# Patient Record
Sex: Male | Born: 1960 | Race: White | Hispanic: No | Marital: Single | Smoking: Current every day smoker
Health system: Southern US, Community
[De-identification: ages and names within clinical notes are randomized; demographics above are authoritative.]

## PROBLEM LIST (undated history)

## (undated) DIAGNOSIS — C801 Malignant (primary) neoplasm, unspecified: Secondary | ICD-10-CM

---

## 2016-07-06 DIAGNOSIS — D689 Coagulation defect, unspecified: Secondary | ICD-10-CM

## 2016-07-06 HISTORY — DX: Coagulation defect, unspecified: D68.9

## 2020-02-13 ENCOUNTER — Other Ambulatory Visit: Payer: Self-pay

## 2020-02-13 ENCOUNTER — Encounter (HOSPITAL_BASED_OUTPATIENT_CLINIC_OR_DEPARTMENT_OTHER): Payer: Self-pay | Admitting: *Deleted

## 2020-02-13 ENCOUNTER — Emergency Department (HOSPITAL_BASED_OUTPATIENT_CLINIC_OR_DEPARTMENT_OTHER)
Admission: EM | Admit: 2020-02-13 | Discharge: 2020-02-13 | Disposition: A | Discharge status: Home / Self Care | Payer: Managed Care, Other (non HMO) | Attending: Emergency Medicine | Admitting: Emergency Medicine

## 2020-02-13 ENCOUNTER — Emergency Department (HOSPITAL_COMMUNITY): Payer: Managed Care, Other (non HMO)

## 2020-02-13 DIAGNOSIS — K8020 Calculus of gallbladder without cholecystitis without obstruction: Secondary | ICD-10-CM

## 2020-02-13 DIAGNOSIS — F17210 Nicotine dependence, cigarettes, uncomplicated: Secondary | ICD-10-CM | POA: Insufficient documentation

## 2020-02-13 DIAGNOSIS — F1090 Alcohol use, unspecified, uncomplicated: Secondary | ICD-10-CM

## 2020-02-13 DIAGNOSIS — Z720 Tobacco use: Secondary | ICD-10-CM

## 2020-02-13 DIAGNOSIS — C250 Malignant neoplasm of head of pancreas: Secondary | ICD-10-CM

## 2020-02-13 DIAGNOSIS — Z789 Other specified health status: Secondary | ICD-10-CM

## 2020-02-13 DIAGNOSIS — Z7289 Other problems related to lifestyle: Secondary | ICD-10-CM

## 2020-02-13 DIAGNOSIS — K8689 Other specified diseases of pancreas: Secondary | ICD-10-CM | POA: Diagnosis not present

## 2020-02-13 DIAGNOSIS — Z79899 Other long term (current) drug therapy: Secondary | ICD-10-CM | POA: Diagnosis not present

## 2020-02-13 DIAGNOSIS — R17 Unspecified jaundice: Secondary | ICD-10-CM

## 2020-02-13 DIAGNOSIS — R7401 Elevation of levels of liver transaminase levels: Secondary | ICD-10-CM

## 2020-02-13 DIAGNOSIS — R5383 Other fatigue: Secondary | ICD-10-CM | POA: Diagnosis present

## 2020-02-13 HISTORY — DX: Elevation of levels of liver transaminase levels: R74.01

## 2020-02-13 HISTORY — DX: Other specified health status: Z78.9

## 2020-02-13 HISTORY — DX: Other problems related to lifestyle: Z72.89

## 2020-02-13 HISTORY — DX: Calculus of gallbladder without cholecystitis without obstruction: K80.20

## 2020-02-13 HISTORY — DX: Unspecified jaundice: R17

## 2020-02-13 HISTORY — DX: Tobacco use: Z72.0

## 2020-02-13 HISTORY — DX: Alcohol use, unspecified, uncomplicated: F10.90

## 2020-02-13 LAB — CBC WITH DIFFERENTIAL/PLATELET
Abs Immature Granulocytes: 0.04 10*3/uL (ref 0.00–0.07)
Basophils Absolute: 0.1 10*3/uL (ref 0.0–0.1)
Basophils Relative: 1 %
Eosinophils Absolute: 0.2 10*3/uL (ref 0.0–0.5)
Eosinophils Relative: 3 %
HCT: 41.5 % (ref 39.0–52.0)
Hemoglobin: 14.6 g/dL (ref 13.0–17.0)
Immature Granulocytes: 1 %
Lymphocytes Relative: 23 %
Lymphs Abs: 1.5 10*3/uL (ref 0.7–4.0)
MCH: 28.9 pg (ref 26.0–34.0)
MCHC: 35.2 g/dL (ref 30.0–36.0)
MCV: 82 fL (ref 80.0–100.0)
Monocytes Absolute: 0.9 10*3/uL (ref 0.1–1.0)
Monocytes Relative: 14 %
Neutro Abs: 3.7 10*3/uL (ref 1.7–7.7)
Neutrophils Relative %: 58 %
Platelets: 294 10*3/uL (ref 150–400)
RBC: 5.06 MIL/uL (ref 4.22–5.81)
RDW: 16.9 % — ABNORMAL HIGH (ref 11.5–15.5)
WBC: 6.4 10*3/uL (ref 4.0–10.5)
nRBC: 0 % (ref 0.0–0.2)

## 2020-02-13 LAB — COMPREHENSIVE METABOLIC PANEL
ALT: 439 U/L — ABNORMAL HIGH (ref 0–44)
AST: 204 U/L — ABNORMAL HIGH (ref 15–41)
Albumin: 3.5 g/dL (ref 3.5–5.0)
Alkaline Phosphatase: 495 U/L — ABNORMAL HIGH (ref 38–126)
Anion gap: 11 (ref 5–15)
BUN: 10 mg/dL (ref 6–20)
CO2: 23 mmol/L (ref 22–32)
Calcium: 8.8 mg/dL — ABNORMAL LOW (ref 8.9–10.3)
Chloride: 98 mmol/L (ref 98–111)
Creatinine, Ser: 0.68 mg/dL (ref 0.61–1.24)
GFR calc Af Amer: >60 mL/min (ref >60)
GFR calc non Af Amer: >60 mL/min (ref >60)
Glucose, Bld: 120 mg/dL — ABNORMAL HIGH (ref 70–99)
Potassium: 3.8 mmol/L (ref 3.5–5.1)
Sodium: 132 mmol/L — ABNORMAL LOW (ref 135–145)
Total Bilirubin: 10.1 mg/dL — ABNORMAL HIGH (ref 0.3–1.2)
Total Protein: 8 g/dL (ref 6.5–8.1)

## 2020-02-13 LAB — LIPASE, BLOOD: Lipase: 18 U/L (ref 11–51)

## 2020-02-13 LAB — PROTIME-INR
INR: 1.1 (ref 0.8–1.2)
Prothrombin Time: 13.8 seconds (ref 11.4–15.2)

## 2020-02-13 LAB — HIV ANTIBODY (ROUTINE TESTING W REFLEX): HIV Screen 4th Generation wRfx: NONREACTIVE

## 2020-02-13 MED ORDER — GADOBUTROL 1 MMOL/ML IV SOLN
7.5000 mL | Freq: Once | INTRAVENOUS | Status: AC | PRN
  Administered 2020-02-13: 7.5 mL via INTRAVENOUS

## 2020-02-13 NOTE — ED Notes (Signed)
Patient denies pain and is resting comfortably.  

## 2020-02-13 NOTE — ED Notes (Signed)
Pt POV to [facility] ED. Pt given directions. Pt advised to remain NPO and told to report directly to the ED without making any additional stops.

## 2020-02-13 NOTE — ED Provider Notes (Addendum)
59 year old male presents with painless jaundice and fatigue from [facility]. Seen at [facility] last week and had a CT abdomen/pelvis on 5/7 which showed:  IMPRESSION: 1. Focal edematous thickening of the pancreatic head and uncinate with adjacent hazy stranding, suggestive of acute interstitial edematous pancreatitis. No visible pancreatic necrosis or acute peripancreatic fluid collection. 2. Mild intra and extrahepatic biliary ductal dilatation. Distended gallbladder without visible calcified gallstone. If there is clinical concern for acute cholecystitis or choledocholithiasis consider next step imaging with right upper quadrant ultrasound. 3. Mild bladder wall thickening, likely related to underdistention. Recommend correlation with urinalysis to exclude cystitis. 4. Several small segments of apparent colonic wall thickening towards the sigmoid and rectum has an appearance most compatible with normal peristalsis. Correlate for acute symptoms. 5. Hepatic steatosis. 6. Punctate nonobstructing upper pole right renal calculus. 7. Peribronchovascular cuffing with diffuse interlobular septal thickening in the lung bases suggest interstitial pulmonary edema. 8. Aortic Atherosclerosis (ICD10-I70.0)  LFTs and bilirubin have been persistently elevated:  AST 271 (May 5) > 230 (May 6) > 204 (today) ALT 576 > 521 > 439 AP: 532 > 551 > 495 Bili: 3.6 > 4.2 > 10.1  His vitals are reassuring here. GI was consulted (Dr. Paulita Fujita) and it was recommended he have a MRCP w contrast therefore was transferred to [facility]. On exam he is calm and cooperative. He is jaundiced. He denies any significant complaints at this time. MRCP ordered and is pending.  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 Will discuss with GI. Pt is upset because he has been NPO - he was given some [place] sandwiches and is happier now.  Discussed with Dr. Paulita Fujita - he states pt will need endoscopic [place] and ERCP however  this cannot be done until Saturday. He states pt can be admitted if he is very symptomatic or he can f/u as an outpatient and have the procedure done early next week. He can also stay with [facility] if he chooses. Discussed these options with pt. He has been comfortable here other than not being able to eat. I discussed with him his diagnosis and he verbalized understanding. He feels comfortable going home and will follow up with GI next week. Epic message was also sent to oncology to expedite him establishing care with them.     Recardo Evangelist, PA-C 02/13/20 2013    Recardo Evangelist, PA-C 02/13/20 2020    Margette Fast, MD 02/14/20 [phone]

## 2020-02-13 NOTE — Plan of Care (Signed)
[facility] is aware of patient's impending arrival to the [facility] emergency room.  I recommend MRI/MRCP to further evaluate.  [facility] is aware of the patient and will see them tomorrow morning.

## 2020-02-13 NOTE — ED Triage Notes (Signed)
Abdominal pain. Jaundiced. Fatigue.

## 2020-02-13 NOTE — ED Provider Notes (Signed)
[place] EMERGENCY DEPARTMENT Provider Note   CSN: [id number] Arrival date & time: 02/13/20  1101     History Chief Complaint  Patient presents with  . Abdominal Pain    Brandon Schmitt is a 59 y.o. male.  HPI   Patient is a 59 year old male with inconsistent primary care follow-up.  Complaining of recent increase over the last few months of exercise intolerance.  He works a physical job as a Architect and says he just does not have the energy that he used to.  He said that he is not having specific shortness of breath and denies any chest pain but just says the energy is not there.  He said he has had some mild intermittent abdominal pains during this but that the size of his belly has not changed.  He recently went to an urgent care for this and they drew some labs after which they told him that he needed to go to the hospital at [facility] for further evaluation.  Imaging there on CT abdomen found chole lithiasis without cholecystitis, there was bile duct dilation and stranding around the pancreatic head.  He did have elevated bilirubin to 4.2, AST ALTs were elevated to 230 and 521 respectively.  Hepatitis panel was negative, creatinine was within normal limits.  Patient left the emergency department AMA and went to work for a week over at [employer] returning now to the [facility] system for the first time because he does not wish to go back to [employer] [facility]  He says he has some intermittent abdominal tenderness but nothing significant and he has been able to eat as well as have normal bowel movements, he said that his urine has been darker and that has him concerned as well as with the fatigue.  He is concerned that he is dehydrated but he has no complaints of dysuria.  Of note patient says he only drinks 2-3 alcoholic drinks per month, he does smoke 1 pack/day of cigarettes, has not had regular primary care in years.  Does have a remote history of blood clot in one  of his legs that he says was "3 feet long ", he said that he was put on anticoagulation for an unknown amount of time after that and was told by the physician that he did not need to take anymore.  The only medications he has been taking regularly was 2 aspirins of unknown dose daily that he heard was good for heart which he has been taking long-term until his visit with [facility] when he stopped after they told him to.  He is also been taking 2 Centrum Silver's per day which again he is stopped after going to [employer].    History reviewed. No pertinent past medical history.  Patient Active Problem List   Diagnosis Date Noted  . Total bilirubin, elevated 02/13/2020  . Elevated transaminase level 02/13/2020  . Tobacco abuse 02/13/2020  . Alcohol use 02/13/2020  . Cholelithiasis 02/13/2020    History reviewed. No pertinent surgical history.     No family history on file.  Social History   Tobacco Use  . Smoking status: Current Every Day Smoker  . Smokeless tobacco: Never Used  Substance Use Topics  . Alcohol use: Yes  . Drug use: Never    Home Medications Prior to Admission medications   Not on File    Allergies    Patient has no known allergies.  Review of Systems  Review of Systems  Constitutional: Positive for fatigue. Negative for activity change, appetite change, chills, diaphoresis and fever.       Energy changed over the last few months, no specific shortness of breath or chest pain  HENT: Negative.   Eyes: Negative.   Respiratory: Negative.   Cardiovascular: Negative.   Gastrointestinal: Positive for abdominal pain. Negative for abdominal distention, anal bleeding, blood in stool, constipation, diarrhea, nausea, rectal pain and vomiting.  Genitourinary: Negative for decreased urine volume, difficulty urinating, dysuria, enuresis, flank pain, frequency and urgency.       Dark urine  Musculoskeletal: Negative.   Skin: Positive for color change.        Jaundice  Neurological: Negative.   Psychiatric/Behavioral: Negative.     Physical Exam Updated Vital Signs BP (!) 142/88   Pulse 77   Temp 98.2 F (36.8 C)   Resp 16   Ht _0  (1.905 m)   Wt 95.7 kg   SpO2 99%   BMI 26.37 kg/m   Physical Exam Vitals and nursing note reviewed.  Constitutional:      General: He is not in acute distress.    Appearance: He is well-developed. He is not toxic-appearing or diaphoretic.  HENT:     Head: Normocephalic.  Cardiovascular:     Rate and Rhythm: Normal rate and regular rhythm.     Heart sounds: Murmur present.  Pulmonary:     Effort: Pulmonary effort is normal. No respiratory distress.     Breath sounds: Normal breath sounds. No stridor. No wheezing, rhonchi or rales.  Abdominal:     General: Abdomen is protuberant. Bowel sounds are normal.     Palpations: Abdomen is soft. There is no shifting dullness, fluid wave or hepatomegaly.     Tenderness: There is no abdominal tenderness. There is no guarding or rebound. Negative signs include Murphy's sign.  Skin:    Coloration: Skin is jaundiced.  Neurological:     Mental Status: He is alert.  Psychiatric:        Mood and Affect: Mood normal. Mood is not anxious or depressed.        Behavior: Behavior normal.     ED Results / Procedures / Treatments   Labs (all labs ordered are listed, but only abnormal results are displayed) Labs Reviewed  CBC WITH DIFFERENTIAL/PLATELET - Abnormal; Notable for the following components:      Result Value   RDW 16.9 (*)    All other components within normal limits  COMPREHENSIVE METABOLIC PANEL - Abnormal; Notable for the following components:   Sodium 132 (*)    Glucose, Bld 120 (*)    Calcium 8.8 (*)    AST 204 (*)    ALT 439 (*)    Alkaline Phosphatase 495 (*)    Total Bilirubin 10.1 (*)    All other components within normal limits  PROTIME-INR  LIPASE, BLOOD  HIV ANTIBODY (ROUTINE TESTING W REFLEX)    EKG None  Radiology No  results found.  Procedures Procedures (including critical care time)  Medications Ordered in ED Medications - No data to display  ED Course  I have reviewed the triage vital signs and the nursing notes.  Pertinent labs & imaging results that were available during my care of the patient were reviewed by me and considered in my medical decision making (see chart for details).    MDM Rules/Calculators/A&P  Charting from prior [facility] emergency department visit was reviewed as well as labs in her own apartment.  Of note bilirubin is more than doubled to 10.1 from prior 4.2.  AST ALT alk phos are all slightly improved.  Patient's vital signs are stable he is not in immediate distress.  Given dictation of prior CT abdomen at [facility] there is concerned that there might be a mass of some sort as he has no significant belly pain consistent with choledocholithiasis or pancreatitis and his lipase is negative.  We did tell him that we are unsure what is causing this and it is all been discussed with him.  Consulted gastrointestinal physician on call Dr. Paulita Fujita, Dr. Paulita Fujita says that we should do an MRCP which is not something we can perform at this facility.  As Dr. Paulita Fujita is also covering [facility] emergency department we have discussed this with the patient and he will transfer via private vehicle at his request to [facility] to get the MRCP.  We did discuss that this does not necessarily mean that he will get admission but that the MRCP will give Dr. Paulita Fujita the ability to make further suggestions for his work-up.  We have also spoken with Dr. Verner Chol at the [facility] emergency department and patient is stable for transport via personal vehicle at this time.  Final Clinical Impression(s) / ED Diagnoses Final diagnoses:  Elevated transaminase level  Total bilirubin, elevated    Rx / DC Orders ED Discharge Orders    None       Sherene Sires, DO  02/13/20 1331    Blanchie Dessert, MD 02/13/20 1347

## 2020-02-13 NOTE — Discharge Instructions (Addendum)
Please follow up with Dr. Paulita Fujita to set up procedure for endoscopic ultrasound and ERCP early next week Their office should call you but if you don't hear from them by noon tomorrow please call their office Return to the ER of you have any severe abdominal pain or itching

## 2020-02-13 NOTE — ED Notes (Signed)
Called MRI to let them know patient arrived from [facility]

## 2020-02-13 NOTE — ED Notes (Signed)
An After Visit Summary was printed and given to the patient. Discharge instructions given and no further questions at this time.  

## 2020-02-14 ENCOUNTER — Other Ambulatory Visit: Payer: Self-pay | Admitting: Gastroenterology

## 2020-02-17 ENCOUNTER — Other Ambulatory Visit (HOSPITAL_COMMUNITY)
Admission: RE | Admit: 2020-02-17 | Discharge: 2020-02-17 | Disposition: A | Discharge status: Home / Self Care | Payer: 59 | Source: Ambulatory Visit | Attending: Gastroenterology | Admitting: Gastroenterology

## 2020-02-17 ENCOUNTER — Telehealth: Payer: Self-pay | Admitting: Oncology

## 2020-02-17 DIAGNOSIS — Z01812 Encounter for preprocedural laboratory examination: Secondary | ICD-10-CM | POA: Insufficient documentation

## 2020-02-17 DIAGNOSIS — Z20822 Contact with and (suspected) exposure to covid-19: Secondary | ICD-10-CM | POA: Diagnosis not present

## 2020-02-17 LAB — SARS CORONAVIRUS 2 (TAT 6-24 HRS): SARS Coronavirus 2: NEGATIVE

## 2020-02-17 NOTE — Telephone Encounter (Signed)
Received a new pt referral from the Emergency Department for pancreatic mass. Brandon Schmitt has been scheduled to see Dr. Benay Spice on 5/25 at 2pm. I cld and lft the appt date and time on the pt's voicemail.

## 2020-02-19 ENCOUNTER — Encounter (HOSPITAL_COMMUNITY): Payer: Self-pay | Admitting: Gastroenterology

## 2020-02-19 ENCOUNTER — Ambulatory Visit (HOSPITAL_COMMUNITY)
Admission: RE | Admit: 2020-02-19 | Discharge: 2020-02-19 | Disposition: A | Discharge status: Home / Self Care | Payer: Managed Care, Other (non HMO) | Attending: Gastroenterology | Admitting: Gastroenterology

## 2020-02-19 ENCOUNTER — Encounter (HOSPITAL_COMMUNITY)
Admission: RE | Disposition: A | Discharge status: Home / Self Care | Payer: Self-pay | Source: Home / Self Care | Attending: Gastroenterology

## 2020-02-19 ENCOUNTER — Ambulatory Visit (HOSPITAL_COMMUNITY): Payer: Managed Care, Other (non HMO)

## 2020-02-19 ENCOUNTER — Ambulatory Visit (HOSPITAL_COMMUNITY): Payer: Managed Care, Other (non HMO) | Admitting: Certified Registered"

## 2020-02-19 ENCOUNTER — Other Ambulatory Visit: Payer: Self-pay

## 2020-02-19 DIAGNOSIS — K8689 Other specified diseases of pancreas: Secondary | ICD-10-CM | POA: Insufficient documentation

## 2020-02-19 DIAGNOSIS — C250 Malignant neoplasm of head of pancreas: Secondary | ICD-10-CM | POA: Diagnosis not present

## 2020-02-19 DIAGNOSIS — K828 Other specified diseases of gallbladder: Secondary | ICD-10-CM | POA: Diagnosis not present

## 2020-02-19 DIAGNOSIS — R748 Abnormal levels of other serum enzymes: Secondary | ICD-10-CM | POA: Insufficient documentation

## 2020-02-19 DIAGNOSIS — F17210 Nicotine dependence, cigarettes, uncomplicated: Secondary | ICD-10-CM | POA: Diagnosis not present

## 2020-02-19 DIAGNOSIS — R17 Unspecified jaundice: Secondary | ICD-10-CM | POA: Diagnosis present

## 2020-02-19 DIAGNOSIS — K831 Obstruction of bile duct: Secondary | ICD-10-CM | POA: Insufficient documentation

## 2020-02-19 DIAGNOSIS — R933 Abnormal findings on diagnostic imaging of other parts of digestive tract: Secondary | ICD-10-CM | POA: Diagnosis not present

## 2020-02-19 DIAGNOSIS — R7989 Other specified abnormal findings of blood chemistry: Secondary | ICD-10-CM | POA: Insufficient documentation

## 2020-02-19 DIAGNOSIS — K838 Other specified diseases of biliary tract: Secondary | ICD-10-CM

## 2020-02-19 DIAGNOSIS — I899 Noninfective disorder of lymphatic vessels and lymph nodes, unspecified: Secondary | ICD-10-CM | POA: Insufficient documentation

## 2020-02-19 HISTORY — PX: EUS: SHX5427

## 2020-02-19 HISTORY — PX: ESOPHAGOGASTRODUODENOSCOPY (EGD) WITH PROPOFOL: SHX5813

## 2020-02-19 HISTORY — PX: SPHINCTEROTOMY: SHX5544

## 2020-02-19 HISTORY — PX: BILIARY STENT PLACEMENT: SHX5538

## 2020-02-19 HISTORY — PX: FINE NEEDLE ASPIRATION: SHX5430

## 2020-02-19 HISTORY — PX: ENDOSCOPIC RETROGRADE CHOLANGIOPANCREATOGRAPHY (ERCP) WITH PROPOFOL: SHX5810

## 2020-02-19 SURGERY — UPPER ENDOSCOPIC ULTRASOUND (EUS) LINEAR
Anesthesia: General

## 2020-02-19 MED ORDER — FENTANYL CITRATE (PF) 100 MCG/2ML IJ SOLN
INTRAMUSCULAR | Status: DC | PRN
  Administered 2020-02-19: 100 ug via INTRAVENOUS

## 2020-02-19 MED ORDER — GLUCAGON HCL RDNA (DIAGNOSTIC) 1 MG IJ SOLR
INTRAMUSCULAR | Status: AC
  Filled 2020-02-19: qty 1

## 2020-02-19 MED ORDER — FENTANYL CITRATE (PF) 100 MCG/2ML IJ SOLN
INTRAMUSCULAR | Status: AC
  Filled 2020-02-19: qty 2

## 2020-02-19 MED ORDER — PROPOFOL 10 MG/ML IV BOLUS
INTRAVENOUS | Status: DC | PRN
  Administered 2020-02-19: 20 mg via INTRAVENOUS
  Administered 2020-02-19: 180 mg via INTRAVENOUS

## 2020-02-19 MED ORDER — SUGAMMADEX SODIUM 200 MG/2ML IV SOLN
INTRAVENOUS | Status: DC | PRN
  Administered 2020-02-19: 200 mg via INTRAVENOUS

## 2020-02-19 MED ORDER — ONDANSETRON HCL 4 MG/2ML IJ SOLN
INTRAMUSCULAR | Status: DC | PRN
  Administered 2020-02-19: 4 mg via INTRAVENOUS

## 2020-02-19 MED ORDER — LIDOCAINE 2% (20 MG/ML) 5 ML SYRINGE
INTRAMUSCULAR | Status: DC | PRN
  Administered 2020-02-19: 100 mg via INTRAVENOUS

## 2020-02-19 MED ORDER — CIPROFLOXACIN IN D5W 400 MG/200ML IV SOLN
INTRAVENOUS | Status: AC
  Filled 2020-02-19: qty 200

## 2020-02-19 MED ORDER — CIPROFLOXACIN IN D5W 400 MG/200ML IV SOLN
INTRAVENOUS | Status: DC | PRN
Start: 2020-02-19 — End: 2020-02-19
  Administered 2020-02-19: 400 mg via INTRAVENOUS

## 2020-02-19 MED ORDER — LACTATED RINGERS IV SOLN: INTRAVENOUS | Status: DC

## 2020-02-19 MED ORDER — PHENYLEPHRINE 40 MCG/ML (10ML) SYRINGE FOR IV PUSH (FOR BLOOD PRESSURE SUPPORT)
PREFILLED_SYRINGE | INTRAVENOUS | Status: DC | PRN
  Administered 2020-02-19 (×2): 120 ug via INTRAVENOUS

## 2020-02-19 MED ORDER — DEXAMETHASONE SODIUM PHOSPHATE 10 MG/ML IJ SOLN
INTRAMUSCULAR | Status: DC | PRN
  Administered 2020-02-19: 4 mg via INTRAVENOUS

## 2020-02-19 MED ORDER — ROCURONIUM BROMIDE 10 MG/ML (PF) SYRINGE
PREFILLED_SYRINGE | INTRAVENOUS | Status: DC | PRN
  Administered 2020-02-19: 50 mg via INTRAVENOUS
  Administered 2020-02-19: 10 mg via INTRAVENOUS
  Administered 2020-02-19: 30 mg via INTRAVENOUS
  Administered 2020-02-19: 10 mg via INTRAVENOUS

## 2020-02-19 MED ORDER — INDOMETHACIN 50 MG RE SUPP
RECTAL | Status: DC | PRN
  Administered 2020-02-19: 100 mg via RECTAL

## 2020-02-19 MED ORDER — SODIUM CHLORIDE 0.9 % IV SOLN: INTRAVENOUS | Status: DC

## 2020-02-19 MED ORDER — PROMETHAZINE HCL 25 MG/ML IJ SOLN: 6.2500 mg | INTRAMUSCULAR | Status: DC | PRN

## 2020-02-19 MED ORDER — PROPOFOL 10 MG/ML IV BOLUS
INTRAVENOUS | Status: AC
  Filled 2020-02-19: qty 20

## 2020-02-19 MED ORDER — INDOMETHACIN 50 MG RE SUPP
RECTAL | Status: AC
  Filled 2020-02-19: qty 2

## 2020-02-19 MED ORDER — EPHEDRINE SULFATE-NACL 50-0.9 MG/10ML-% IV SOSY
PREFILLED_SYRINGE | INTRAVENOUS | Status: DC | PRN
  Administered 2020-02-19: 10 mg via INTRAVENOUS

## 2020-02-19 MED ORDER — SODIUM CHLORIDE 0.9 % IV SOLN
INTRAVENOUS | Status: DC | PRN
  Administered 2020-02-19: 30 mL

## 2020-02-19 MED ORDER — FENTANYL CITRATE (PF) 100 MCG/2ML IJ SOLN: 25.0000 ug | INTRAMUSCULAR | Status: DC | PRN

## 2020-02-19 NOTE — Anesthesia Procedure Notes (Signed)
Procedure Name: Intubation Date/Time: 02/19/2020 10:55 AM Performed by: Niel Hummer, CRNA Pre-anesthesia Checklist: Patient identified, Emergency Drugs available, Suction available and Patient being monitored Patient Re-evaluated:Patient Re-evaluated prior to induction Oxygen Delivery Method: Circle system utilized Preoxygenation: Pre-oxygenation with 100% oxygen Induction Type: IV induction Ventilation: Two handed mask ventilation required Laryngoscope Size: Mac and 4 Grade View: Grade II Tube type: Oral Tube size: 7.5 mm Number of attempts: 1 Airway Equipment and Method: Stylet Placement Confirmation: ETT inserted through vocal cords under direct vision,  positive ETCO2 and breath sounds checked- equal and bilateral Secured at: 22 cm Tube secured with: Tape Dental Injury: Teeth and Oropharynx as per pre-operative assessment

## 2020-02-19 NOTE — Anesthesia Preprocedure Evaluation (Addendum)
Anesthesia Evaluation  Patient identified by MRN, date of birth, ID band Patient awake    Reviewed: Allergy & Precautions, NPO status , Patient's Chart, lab work & pertinent test results  History of Anesthesia Complications Negative for: history of anesthetic complications  Airway Mallampati: II  TM Distance: >3 FB Neck ROM: Full    Dental no notable dental hx.    Pulmonary Current Smoker,    Pulmonary exam normal        Cardiovascular negative cardio ROS Normal cardiovascular exam     Neuro/Psych negative neurological ROS  negative psych ROS   GI/Hepatic Neg liver ROS, Elevated LFTs, jaundice, abnormal CT, dilated biliary duct   Endo/Other  negative endocrine ROS  Renal/GU negative Renal ROS  negative genitourinary   Musculoskeletal negative musculoskeletal ROS (+)   Abdominal   Peds  Hematology negative hematology ROS (+)   Anesthesia Other Findings Day of surgery medications reviewed with patient.  Reproductive/Obstetrics negative OB ROS                            Anesthesia Physical Anesthesia Plan  ASA: III  Anesthesia Plan: General   Post-op Pain Management:    Induction: Intravenous  PONV Risk Score and Plan: 2 and Treatment may vary due to age or medical condition, Ondansetron, Dexamethasone and Midazolam  Airway Management Planned: Oral ETT  Additional Equipment: None  Intra-op Plan:   Post-operative Plan: Extubation in OR  Informed Consent: I have reviewed the patients History and Physical, chart, labs and discussed the procedure including the risks, benefits and alternatives for the proposed anesthesia with the patient or authorized representative who has indicated his/her understanding and acceptance.     Dental advisory given  Plan Discussed with: CRNA  Anesthesia Plan Comments:        Anesthesia Quick Evaluation

## 2020-02-19 NOTE — Op Note (Signed)
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 Estimated Blood Loss:     Estimated blood loss: none. Procedure:                Pre-Anesthesia Assessment:                           - Prior to the procedure, a History and Physical                            was performed, and patient medications and                            allergies were reviewed. The patient's tolerance of                            previous anesthesia was also reviewed. The risks                            and benefits of the procedure and the sedation                            options and risks were discussed with the patient.                            All questions were answered, and informed consent                            was obtained. Prior Anticoagulants: The patient has                            taken no previous anticoagulant or antiplatelet                            agents. ASA Grade Assessment: III - A patient with                            severe  systemic disease. After reviewing the risks                            and benefits, the patient was deemed in                            satisfactory condition to undergo the procedure.                           - Prior to the procedure, a History and Physical                            was performed, and patient medications and                            allergies were reviewed. The patient's tolerance  of                            previous anesthesia was also reviewed. The risks                            and benefits of the procedure and the sedation                            options and risks were discussed with the patient.                            All questions were answered, and informed consent                            was obtained. Prior Anticoagulants: The patient has                            taken no previous anticoagulant or antiplatelet                            agents. ASA Grade Assessment: II - A patient with                            mild systemic disease. After reviewing the risks                            and benefits, the patient was deemed in                            satisfactory condition to undergo the procedure.                           After obtaining informed consent, the scope was                            passed under direct vision. Throughout the  procedure, the patient's blood pressure, pulse, and                            oxygen saturations were monitored continuously. The                            [id number]) Olympus Doudenoscope was                            introduced through the mouth, and used to inject                            contrast into and used to cannulate the bile duct.                            The ERCP was accomplished without difficulty. The                            patient tolerated the procedure well. Scope In: Scope Out: Findings:      The major papilla was congested. Deep selective cannulation was fairly       readily obtained without any pancreatic duct wire advancement or       injection and a distal CBD stricture was confirmed with a dilated       intrahepatics and we proceeded with the biliary sphincterotomy which was       made with a Hydratome sphincterotome using ERBE electrocautery. There       was no post-sphincterotomy bleeding. We proceeded with a medium size        sphincterotomy until we had adequate biliary drainage and the common       bile duct contained a single localized stenosis 3 mm in length as       measured using the sphincterotome with its markings and therefore we       elected to place One 10 Fr by 6 cm uncovered metal stent was placed 5.5       cm into the common bile duct. Bile flowed through the stent. The stent       was in good position. Impression:               - The major papilla appeared congested.                           - A single localized biliary stricture was found in                            the common bile duct. The stricture was malignant                            appearing. This stricture was treated with biliary                            sphincterotomy.                           - A sphincterotomy was performed.                           -  One uncovered metal stent was placed into the                            common bile duct. Moderate Sedation:      Not Applicable - Patient had care per Anesthesia. Recommendation:           - Clear liquid diet for 6 hours.                           - Continue present medications.                           - Return to GI clinic PRN.                           - Await path results.                           - Telephone GI clinic if symptomatic PRN.                           - Refer to an oncologist at appointment to be                            scheduled.                           - Telephone GI clinic for pathology results in 4                            days. Procedure Code(s):        --- Professional ---                           [phone], Endoscopic retrograde                            cholangiopancreatography (ERCP); with placement of                            endoscopic stent into biliary or pancreatic duct,                            including pre- and post-dilation and guide wire                            passage, when performed, including sphincterotomy,                             when performed, each stent Diagnosis Code(s):        --- Professional ---                           K83.1, Obstruction of bile duct                           C25.0, Malignant neoplasm of  head of pancreas                           K83.8, Other specified diseases of biliary tract CPT copyright 2019 American Medical Association. All rights reserved. The codes documented in this report are preliminary and upon coder review may  be revised to meet current compliance requirements. Clarene Essex, MD 02/19/2020 12:46:32 PM This report has been signed electronically. Arta Silence, MD Number of Addenda: 0

## 2020-02-19 NOTE — Anesthesia Postprocedure Evaluation (Signed)
Anesthesia Post Note  Patient: Brandon Schmitt  Procedure(s) Performed: UPPER ENDOSCOPIC ULTRASOUND (EUS) LINEAR (N/A ) FINE NEEDLE ASPIRATION (FNA) LINEAR (N/A ) SPHINCTEROTOMY BILIARY STENT PLACEMENT (N/A ) ENDOSCOPIC RETROGRADE CHOLANGIOPANCREATOGRAPHY (ERCP) WITH PROPOFOL (N/A )     Patient location during evaluation: PACU Anesthesia Type: General Level of consciousness: awake and alert and oriented Pain management: pain level controlled Vital Signs Assessment: post-procedure vital signs reviewed and stable Respiratory status: spontaneous breathing, nonlabored ventilation and respiratory function stable Cardiovascular status: blood pressure returned to baseline Postop Assessment: no apparent nausea or vomiting Anesthetic complications: no    Last Vitals:  Vitals:   02/19/20 1310 02/19/20 1315  BP: (!) 172/101 (!) 180/104  Pulse: 77 78  Resp: 15 15  Temp:    SpO2: 98% 99%    Last Pain:  Vitals:   02/19/20 1247  TempSrc: Oral  PainSc: 0-No pain                 Brennan Bailey

## 2020-02-19 NOTE — Op Note (Signed)
[facility] Patient Name: Brandon Schmitt Procedure Date: 02/19/2020 MRN: [id number] Attending MD: Arta Silence , MD Date of Birth: 1960-10-18 CSN: [id number] Age: 59 Admit Type: Outpatient Procedure:                Upper EUS Indications:              Common bile duct dilation (acquired) seen on MRI,                            Suspected mass in pancreas on MRI, Elevated liver                            enzymes Providers:                Arta Silence, MD, Cleda Daub, RN, Marguerita Merles, Technician Referring MD:              Medicines:                General Anesthesia Complications:            No immediate complications. Estimated Blood Loss:     Estimated blood loss was minimal. Procedure:                Pre-Anesthesia Assessment:                           - Prior to the procedure, a History and Physical                            was performed, and patient medications and                            allergies were reviewed. The patient's tolerance of                            previous anesthesia was also reviewed. The risks                            and benefits of the procedure and the sedation                            options and risks were discussed with the patient.                            All questions were answered, and informed consent                            was obtained. Prior Anticoagulants: The patient has                            taken no previous anticoagulant or antiplatelet                            agents. ASA Grade  Assessment: III - A patient with                            severe systemic disease. After reviewing the risks                            and benefits, the patient was deemed in                            satisfactory condition to undergo the procedure.                           After obtaining informed consent, the endoscope was                            passed under direct vision. Throughout  the                            procedure, the patient's blood pressure, pulse, and                            oxygen saturations were monitored continuously. The                            ([id number]) [id number] Linear EUS was introduced                            through the mouth, and advanced to the second part                            of duodenum. The upper EUS was accomplished without                            difficulty. The patient tolerated the procedure                            well. Scope In: Scope Out: Findings:      ENDOSONOGRAPHIC FINDING: :      There was dilation in the common bile duct which measured up to 15 mm.      A few malignant-appearing lymph nodes were visualized in the       peripancreatic region and porta hepatis region. The nodes were       irregular, hypoechoic and had well defined margins.      An irregular mass was identified in the pancreatic head. The mass was       hypoechoic. The mass measured approximately 40 mm by 32 mm in maximal       cross-sectional diameter. The endosonographic borders were       poorly-defined. An intact interface was seen between the mass and the       superior mesenteric artery and celiac artery and portal vein and SMV       suggesting a lack of invasion. The remainder of the pancreas was       examined. The endosonographic appearance of parenchyma and the upstream       pancreatic  duct indicated duct dilation. Fine needle aspiration for       cytology was performed. Color Doppler imaging was utilized prior to       needle puncture to confirm a lack of significant vascular structures       within the needle path. Three passes were made with the 25 gauge needle       using a transduodenal approach. A stylet was used. A cytologist was       present and performed a preliminary cytologic examination. Preliminary       cytology is suspicious for adenocarcinoma (final results are pending).      There was dilation in the  gallbladder. Impression:               - There was dilation in the common bile duct which                            measured up to 15 mm.                           - A few malignant-appearing lymph nodes were                            visualized in the peripancreatic region and porta                            hepatis region.                           - A mass was identified in the pancreatic head.                            This was staged T3 N1 Mx by endosonographic                            criteria. Fine needle aspiration performed.                           - There was dilation in the gallbladder. Moderate Sedation:      None Recommendation:           - Await cytology results.                           - Perform an ERCP today. Procedure Code(s):        --- Professional ---                           [phone], Esophagogastroduodenoscopy, flexible,                            transoral; with transendoscopic ultrasound-guided                            intramural or transmural fine needle                            aspiration/biopsy(s), (includes endoscopic  ultrasound examination limited to the esophagus,                            stomach or duodenum, and adjacent structures) Diagnosis Code(s):        --- Professional ---                           I89.9, Noninfective disorder of lymphatic vessels                            and lymph nodes, unspecified                           K86.89, Other specified diseases of pancreas                           R74.8, Abnormal levels of other serum enzymes                           K83.8, Other specified diseases of biliary tract                           K82.8, Other specified diseases of gallbladder                           R93.3, Abnormal findings on diagnostic imaging of                            other parts of digestive tract CPT copyright 2019 American Medical Association. All rights reserved. The codes documented  in this report are preliminary and upon coder review may  be revised to meet current compliance requirements. Arta Silence, MD 02/19/2020 12:10:22 PM This report has been signed electronically. Number of Addenda: 0

## 2020-02-19 NOTE — Transfer of Care (Signed)
Immediate Anesthesia Transfer of Care Note  Patient: Brandon Schmitt  Procedure(s) Performed: UPPER ENDOSCOPIC ULTRASOUND (EUS) LINEAR (N/A ) FINE NEEDLE ASPIRATION (FNA) LINEAR (N/A ) ENDOSCOPIC RETROGRADE CHOLANGIOPANCREATOGRAPHY (ERCP) WITH PROPOFOL (N/A )  Patient Location: PACU  Anesthesia Type:General  Level of Consciousness: awake, alert  and oriented  Airway & Oxygen Therapy: Patient Spontanous Breathing and Patient connected to face mask oxygen  Post-op Assessment: Report given to RN, Post -op Vital signs reviewed and stable and Patient moving all extremities X 4  Post vital signs: Reviewed and stable  Last Vitals:  Vitals Value Taken Time  BP    Temp    Pulse    Resp    SpO2      Last Pain:  Vitals:   02/19/20 1011  TempSrc: Oral  PainSc: 0-No pain         Complications: No apparent anesthesia complications

## 2020-02-19 NOTE — H&P (Signed)
[facility] Admission Note  Chief Complaint:  Jaundice  HPI: Brandon Schmitt is an 59 y.o. male presenting with painless jaundice.  No abdominal pain, fevers, pruritus.  Imaging shows pancreatic mass and dilated bile duct.  Elevated LFTs on labs.    History reviewed. No pertinent past medical history.  History reviewed. No pertinent surgical history.  Medications Prior to Admission  Medication Sig Dispense Refill  . Multiple Vitamins-Minerals (MULTIVITAMIN WITH MINERALS) tablet Take 1 tablet by mouth daily.      Allergies: No Known Allergies  History reviewed. No pertinent family history.  Social History:  reports that he has been smoking cigarettes. He has been smoking about 0.00 packs per day for the past 25.00 years. He has never used smokeless tobacco. He reports current alcohol use. He reports that he does not use drugs.   ROS: As per HPI, all others negative  Blood pressure (!) 161/98, pulse 75, temperature 98.1 F (36.7 C), temperature source Oral, resp. rate 16, height 6\' 3"  (1.905 m), weight 95.7 kg, SpO2 98 %. General appearance:  Jaundiced, NAD SKIN:  Jaundiced HEENT:  Scleral icterus ABD:  Soft, non-tender  Results for orders placed or performed during the hospital encounter of 02/17/20 (from the past 48 hour(s))  SARS CORONAVIRUS 2 (TAT 6-24 HRS) Nasopharyngeal Nasopharyngeal Swab     Status: None   Collection Time: 02/17/20  1:15 PM   Specimen: Nasopharyngeal Swab  Result Value Ref Range   SARS Coronavirus 2 NEGATIVE NEGATIVE    Comment: (NOTE) SARS-CoV-2 target nucleic acids are NOT DETECTED. The SARS-CoV-2 RNA is generally detectable in upper and lower respiratory specimens during the acute phase of infection. Negative results do not preclude SARS-CoV-2 infection, do not rule out co-infections with other pathogens, and should not be used as the sole basis for treatment or other patient management decisions. Negative results must be combined with  clinical observations, patient history, and epidemiological information. The expected result is Negative. Fact Sheet for Patients: SugarRoll.be Fact Sheet for Healthcare Providers: https://www.woods-mathews.com/ This test is not yet approved or cleared by the [place] FDA and  has been authorized for detection and/or diagnosis of SARS-CoV-2 by FDA under an Emergency Use Authorization (EUA). This EUA will remain  in effect (meaning this test can be used) for the duration of the COVID-19 declaration under Section 56 4(b)(1) of the Act, 21 U.S.C. section 360bbb-3(b)(1), unless the authorization is terminated or revoked sooner. Performed at [facility], [place] [address] [place], [place] [address]    No results found.  Assessment:  1.  Obstructive jaundice.   2.  Pancreatic mass on MRI. 3.  Dilated bile duct and elevated LFTs.  Plan:  1.  Upper endoscopic ultrasound with anticipated fine needle aspiration. 2.  Risks (bleeding, infection, bowel perforation that could require surgery, sedation-related changes in cardiopulmonary systems), benefits (identification and possible treatment of source of symptoms, exclusion of certain causes of symptoms), and alternatives (watchful waiting, radiographic imaging studies, empiric medical treatment) of upper endoscopy with ultrasound and anticipated fine needle aspiration (EUS +/- FNA) were explained to patient/family in detail and patient wishes to proceed. 3.  ERCP for anticipated bile duct stent placement. 4.  Risks (up to and including bleeding, infection, perforation, pancreatitis that can be complicated by infected necrosis and death), benefits (removal of stones, alleviating blockage, decreasing risk of cholangitis or choledocholithiasis-related pancreatitis), and alternatives (watchful waiting, percutaneous transhepatic cholangiography) of ERCP were explained to patient/family in detail and  patient elects to  proceed.  MICA, KOWALICK 02/19/2020, 10:41 AM

## 2020-02-19 NOTE — Discharge Instructions (Signed)
Call if question or problem otherwise call in 4 days for pathology results and will set up oncology appointment at that time and begin with clear liquids today and if doing okay at 6 PM may have soft solids

## 2020-02-20 ENCOUNTER — Encounter: Payer: Self-pay | Admitting: *Deleted

## 2020-02-20 LAB — CYTOLOGY - NON PAP

## 2020-02-21 NOTE — Progress Notes (Signed)
Spoke with patient regarding upcoming appointment on 5/25 at 2 pm with Dr. Benay Spice.  He states he was unaware of this appointment.  I clarified the appointment and that he will be meeting with Dr. Benay Spice medical oncologist that date to discuss findings and treatment plan.  I explained he can bring one person with him to this appointment.  He is aware of our location.  I explained my role as GI nurse navigator.  He verbalized an understanding and appreciated the call.

## 2020-02-24 ENCOUNTER — Ambulatory Visit
Admission: RE | Admit: 2020-02-24 | Discharge: 2020-02-24 | Disposition: A | Discharge status: Home / Self Care | Payer: Self-pay | Source: Ambulatory Visit | Attending: Oncology | Admitting: Oncology

## 2020-02-24 ENCOUNTER — Other Ambulatory Visit: Payer: Self-pay | Admitting: *Deleted

## 2020-02-24 DIAGNOSIS — C259 Malignant neoplasm of pancreas, unspecified: Secondary | ICD-10-CM

## 2020-02-24 NOTE — Progress Notes (Signed)
Email to [facility] CT department to upload CT images from [facility] on 02/07/20.

## 2020-02-25 ENCOUNTER — Other Ambulatory Visit: Payer: Self-pay

## 2020-02-25 ENCOUNTER — Inpatient Hospital Stay: Payer: Managed Care, Other (non HMO) | Attending: Oncology | Admitting: Oncology

## 2020-02-25 VITALS — BP 149/83 | HR 78 | Temp 97.9°F | Resp 18 | Ht 75.0 in | Wt 215.0 lb

## 2020-02-25 DIAGNOSIS — C259 Malignant neoplasm of pancreas, unspecified: Secondary | ICD-10-CM

## 2020-02-25 NOTE — Progress Notes (Signed)
Met with patient and his fiance Legrand Rams today at his initial medical oncology appointment with Dr. Benay Spice.  I have printed off information on Oxaliplatin, Leucovorin, Irinotecan and Fluorouracil which was given to them.  I also supplied them with a book on Pancreatic Cancer (by [employer]).  They were provided with a demonstration model of a port a cath.   I have scheduled them to see Dr. Barry Dienes on 6/1 at 1:55 and they were told to arrive by 1:25.  They were given my direct number to contact with any questions or concerns they may have.  They were given our fax number for work disability papers and told it is a 7 to 10 day turnaround for completion.  I faxed over referral information to Dr. Marlowe Aschoff office.

## 2020-02-25 NOTE — Progress Notes (Signed)
[name]   Requesting MD: Recardo Evangelist[facility] [place],  [place] [address]   Brandon Schmitt 59 y.o.  1961/03/17    Reason for Consult: Pancreas cancer   HPI: Brandon Schmitt was evaluated at a [facility] internal medicine clinic on 02/05/2020 with fatigue.  The liver enzymes returned markedly elevated.  He was referred to the emergency room.  A CT of the abdomen and pelvis on 02/07/2020 at [facility] revealed hepatic steatosis, a distended gallbladder and mild intra and extrahepatic biliary ductal dilatation.  Edematous thickening of the pancreas head and uncinate with adjacent stranding.  "Reactive "adenopathy in the retroperitoneum at the pancreas.  No pathologic enlarged lymph nodes.  The findings were felt to be consistent with "acute interstitial edematous pancreatitis ".  He decided to leave the emergency room.  He presented to the [facility] emergency room on 02/13/2020 with malaise and jaundice.  He was referred to the [facility] emergency room.  An MRI abdomen/MRCP on 02/13/2020 confirmed a 4.3 cm enhancing mass in the pancreas head and uncinate suspicious for pancreas adenocarcinoma.  Diffuse biliary and pancreatic ductal dilatation with an abrupt stricture in the region of the pancreas head.  No evidence of metastatic disease.  Distended gallbladder.  Gastroenterology was consulted.  An ERCP by Dr. Watt Climes on 02/19/2020 confirmed a distal common bile duct stricture.  An uncovered metal stent was placed in the common bile duct.  An upper EUS by Dr. Paulita Fujita on 02/19/2020 revealed malignant appearing lymph nodes in the peripancreatic and porta hepatis regions.  A 40 x 32 mm mass was noted in the pancreas head no evidence of vascular invasion.  A fine-needle aspiration biopsy was performed.  The lesion was staged as a T3N1 by ultrasound criteria. The cytology revealed malignant cells consistent with adenocarcinoma.  He reports  feeling better after placement of the bile duct stent.  Jaundice has improved.  Past medical history: 1.  Hypertension 2.  Left leg deep vein thrombosis in approximately 2014, treated in [place]   Past Surgical History:  Procedure Laterality Date  . BILIARY STENT PLACEMENT N/A 02/19/2020   Procedure: BILIARY STENT PLACEMENT;  Surgeon: Arta Silence, MD;  Location: [facility];  Service: Endoscopy;  Laterality: N/A;  . ENDOSCOPIC RETROGRADE CHOLANGIOPANCREATOGRAPHY (ERCP) WITH PROPOFOL N/A 02/19/2020   Procedure: ENDOSCOPIC RETROGRADE CHOLANGIOPANCREATOGRAPHY (ERCP) WITH PROPOFOL;  Surgeon: Arta Silence, MD;  Location: [facility];  Service: Endoscopy;  Laterality: N/A;  . ENDOSCOPIC RETROGRADE CHOLANGIOPANCREATOGRAPHY (ERCP) WITH PROPOFOL N/A 02/19/2020   Procedure: ENDOSCOPIC RETROGRADE CHOLANGIOPANCREATOGRAPHY (ERCP) WITH PROPOFOL;  Surgeon: Clarene Essex, MD;  Location: [facility];  Service: Endoscopy;  Laterality: N/A;  EUS FIRST THEN ERCP  . ESOPHAGOGASTRODUODENOSCOPY (EGD) WITH PROPOFOL N/A 02/19/2020   Procedure: ESOPHAGOGASTRODUODENOSCOPY (EGD) WITH PROPOFOL;  Surgeon: Arta Silence, MD;  Location: [facility];  Service: Endoscopy;  Laterality: N/A;  . EUS N/A 02/19/2020   Procedure: UPPER ENDOSCOPIC ULTRASOUND (EUS) LINEAR;  Surgeon: Arta Silence, MD;  Location: [facility];  Service: Endoscopy;  Laterality: N/A;  EUS FIRST THEN ERCP with DR MAGOD  . FINE NEEDLE ASPIRATION N/A 02/19/2020   Procedure: FINE NEEDLE ASPIRATION (FNA) LINEAR;  Surgeon: Arta Silence, MD;  Location: [facility];  Service: Endoscopy;  Laterality: N/A;  . SPHINCTEROTOMY  02/19/2020   Procedure: SPHINCTEROTOMY;  Surgeon: Arta Silence, MD;  Location: [facility];  Service: Endoscopy;;    .  Procedure for removal of a "clot "from the left leg in  2014  Medications: Reviewed  Allergies: No Known Allergies  Family history: No family history of cancer  Social History:   He lives in  [place].  He works as a Diplomatic Services operational officer basis.  He smokes 1/2 pack of cigarettes per day.  He does not use alcohol.  No transfusion history.  No risk factor for HIV or hepatitis.  ROS:   Positives include: Malaise, dark urine-improved, 40 pound weight loss  A complete ROS was otherwise negative.  Physical Exam:  Blood pressure (!) 149/83, pulse 78, temperature 97.9 F (36.6 C), temperature source Temporal, resp. rate 18, height 6\' 3"  (1.905 m), weight 215 lb (97.5 kg), SpO2 100 %.  HEENT: Neck without mass Lungs: Clear bilaterally Cardiac: Regular rate and rhythm Abdomen: No hepatosplenomegaly, no mass, nontender  Vascular: No leg edema, the left lower leg is slightly larger than the right side Lymph nodes: No cervical, supraclavicular, axillary, or inguinal nodes Neurologic: Alert and oriented, the motor exam appears intact in the upper and lower extremities bilaterally Skin: Mild jaundice Musculoskeletal: No spine tenderness   LAB:  CBC  Lab Results  Component Value Date   WBC 6.4 02/13/2020   HGB 14.6 02/13/2020   HCT 41.5 02/13/2020   MCV 82.0 02/13/2020   PLT 294 02/13/2020   NEUTROABS 3.7 02/13/2020        CMP  Lab Results  Component Value Date   NA 132 (L) 02/13/2020   K 3.8 02/13/2020   CL 98 02/13/2020   CO2 23 02/13/2020   GLUCOSE 120 (H) 02/13/2020   BUN 10 02/13/2020   CREATININE 0.68 02/13/2020   CALCIUM 8.8 (L) 02/13/2020   PROT 8.0 02/13/2020   ALBUMIN 3.5 02/13/2020   AST 204 (H) 02/13/2020   ALT 439 (H) 02/13/2020   ALKPHOS 495 (H) 02/13/2020   BILITOT 10.1 (H) 02/13/2020   GFRNONAA >60 02/13/2020   GFRAA >60 02/13/2020     Imaging: CT images from [facility] 02/07/2020 and MRI abdomen 02/13/2020 reviewed with Brandon Schmitt     Assessment/Plan:   1. Pancreas cancer  CT at [facility] 02/07/2020-edematous thickening of the pancreas head and uncinate with adjacent stranding suggestive of pancreatitis, mild intra and  extrahepatic biliary ductal dilatation, distended gallbladder, "reactive "adenopathy in the retroperitoneum  MRI abdomen/MRCP 02/13/2020-4.3 cm enhancing soft tissue mass in the pancreas head and uncinate not encasing the SMV or SMA, diffuse biliary and pancreatic ductal dilatation with abrupt stricture in the head of the pancreas, no evidence of metastatic disease, distended gallbladder  EUS 02/19/2020 dilated common bile duct, malignant appearing lymph nodes in the peripancreatic and porta hepatis regions, pancreas head mass measuring 40 x 32 mm, no evidence of vascular invasion, T3N1.  EUS biopsy of pancreas head mass 02/19/2020-malignant cells consistent with adenocarcinoma 2. Obstructive jaundice secondary to #1  ERCP 02/19/2020-single biliary stricture in the common bile duct, status post sphincterotomy and placement of an uncovered metal stent 3.   Hypertension 4.   History of a left leg "DVT "while in [place] in approximately 2014    Disposition:    Brandon Schmitt has been diagnosed with pancreas cancer.  The staging evaluation to date reveals no evidence of metastatic disease and the tumor appears resectable.  I discussed treatment options with Brandon Schmitt and his fiance.  He understands the only potentially curative therapy is surgery.  We discussed the role of neoadjuvant and adjuvant systemic therapy.  He appears to have an adequate performance status to receive neoadjuvant  FOLFIRINOX therapy.  I recommend neoadjuvant FOLFIRINOX to be followed by surgery.  We reviewed potential toxicities associated with the FOLFIRINOX regimen including the chance of nausea/vomiting, mucositis, diarrhea, alopecia, and hematologic toxicity.  We discussed the rash, sun sensitivity, hyperpigmentation, and hand/foot syndrome associated with 5-fluorouracil.  We reviewed the acute and delayed diarrhea seen with irinotecan.  We discussed the allergic reaction and various types of neuropathy secondary to  oxaliplatin.  We discussed the potential need for growth factor support.  Brandon Schmitt will be referred to Dr. Barry Dienes for a surgical consult and Port-A-Cath placement.  He will attend a chemotherapy teaching class.  He will be referred to the genetics counselor.  He has a history of a lower extremity deep vein thrombosis, unprovoked per his report.  I explained the increased risk of venous thromboembolic disease in patients with cancer, especially pancreas cancer.  He will contact us for symptoms of venous thrombosis.  He indicated a desire for a second opinion prior to deciding on treatment.  He plans to schedule appointment with medical oncology at [facility].  Brandon Schmitt will return for an office visit and further discussion on 03/13/2020.    Betsy Coder, MD  02/25/2020, 4:55 PM

## 2020-02-26 ENCOUNTER — Telehealth: Payer: Self-pay | Admitting: Oncology

## 2020-02-26 ENCOUNTER — Other Ambulatory Visit: Payer: Self-pay

## 2020-02-26 NOTE — Telephone Encounter (Signed)
Scheduled per 5/25 los. Left voicemail- appt with MD on 6/11 Scheduled per 5/26 sch message. Left voicemail- appts with genetics on 6/15.

## 2020-03-09 ENCOUNTER — Other Ambulatory Visit: Payer: Self-pay | Admitting: General Surgery

## 2020-03-09 DIAGNOSIS — C250 Malignant neoplasm of head of pancreas: Secondary | ICD-10-CM

## 2020-03-13 ENCOUNTER — Inpatient Hospital Stay: Payer: Managed Care, Other (non HMO) | Admitting: Oncology

## 2020-03-13 NOTE — Progress Notes (Signed)
Spoke with patient regarding scheduled follow up today with Dr. Benay Spice.  He would like to cancel this appointment and he will call back to reschedule after he has second opinion at [facility] next week and CT scans on 6/24 ordered by Dr. Barry Dienes.  He states he feels well, eating well, gaining weight, and denies any pain or jaundice.  Dr. Benay Spice was made aware.

## 2020-03-17 ENCOUNTER — Other Ambulatory Visit: Payer: 59

## 2020-03-17 ENCOUNTER — Encounter: Payer: 59 | Admitting: Genetic Counselor

## 2020-03-26 ENCOUNTER — Other Ambulatory Visit: Payer: 59

## 2020-03-31 DIAGNOSIS — C259 Malignant neoplasm of pancreas, unspecified: Secondary | ICD-10-CM

## 2020-03-31 HISTORY — DX: Malignant neoplasm of pancreas, unspecified: C25.9

## 2020-04-01 NOTE — Progress Notes (Signed)
Attempted to contact patient to find out what he has decided as far as his treatment location here or [facility].  I had to leave a voice message and I requested that he return my call to let me know.

## 2020-09-29 ENCOUNTER — Emergency Department (HOSPITAL_BASED_OUTPATIENT_CLINIC_OR_DEPARTMENT_OTHER): Payer: Managed Care, Other (non HMO)

## 2020-09-29 ENCOUNTER — Other Ambulatory Visit: Payer: Self-pay

## 2020-09-29 ENCOUNTER — Emergency Department (HOSPITAL_BASED_OUTPATIENT_CLINIC_OR_DEPARTMENT_OTHER)
Admission: EM | Admit: 2020-09-29 | Discharge: 2020-09-29 | Disposition: A | Discharge status: Home / Self Care | Payer: Managed Care, Other (non HMO) | Attending: Emergency Medicine | Admitting: Emergency Medicine

## 2020-09-29 ENCOUNTER — Encounter (HOSPITAL_BASED_OUTPATIENT_CLINIC_OR_DEPARTMENT_OTHER): Payer: Self-pay | Admitting: *Deleted

## 2020-09-29 DIAGNOSIS — I5021 Acute systolic (congestive) heart failure: Secondary | ICD-10-CM | POA: Insufficient documentation

## 2020-09-29 DIAGNOSIS — Z8507 Personal history of malignant neoplasm of pancreas: Secondary | ICD-10-CM | POA: Insufficient documentation

## 2020-09-29 DIAGNOSIS — R0602 Shortness of breath: Secondary | ICD-10-CM | POA: Diagnosis present

## 2020-09-29 DIAGNOSIS — F17210 Nicotine dependence, cigarettes, uncomplicated: Secondary | ICD-10-CM | POA: Diagnosis not present

## 2020-09-29 DIAGNOSIS — I509 Heart failure, unspecified: Secondary | ICD-10-CM

## 2020-09-29 HISTORY — DX: Malignant (primary) neoplasm, unspecified: C80.1

## 2020-09-29 LAB — COMPREHENSIVE METABOLIC PANEL
ALT: 150 U/L — ABNORMAL HIGH (ref 0–44)
AST: 171 U/L — ABNORMAL HIGH (ref 15–41)
Albumin: 3.5 g/dL (ref 3.5–5.0)
Alkaline Phosphatase: 313 U/L — ABNORMAL HIGH (ref 38–126)
Anion gap: 9 (ref 5–15)
BUN: 13 mg/dL (ref 6–20)
CO2: 21 mmol/L — ABNORMAL LOW (ref 22–32)
Calcium: 8.6 mg/dL — ABNORMAL LOW (ref 8.9–10.3)
Chloride: 103 mmol/L (ref 98–111)
Creatinine, Ser: 0.82 mg/dL (ref 0.61–1.24)
GFR, Estimated: >60 mL/min (ref >60)
Glucose, Bld: 118 mg/dL — ABNORMAL HIGH (ref 70–99)
Potassium: 3.8 mmol/L (ref 3.5–5.1)
Sodium: 133 mmol/L — ABNORMAL LOW (ref 135–145)
Total Bilirubin: 2 mg/dL — ABNORMAL HIGH (ref 0.3–1.2)
Total Protein: 7.3 g/dL (ref 6.5–8.1)

## 2020-09-29 LAB — CBC WITH DIFFERENTIAL/PLATELET
Abs Immature Granulocytes: 0.01 10*3/uL (ref 0.00–0.07)
Basophils Absolute: 0 10*3/uL (ref 0.0–0.1)
Basophils Relative: 1 %
Eosinophils Absolute: 0.1 10*3/uL (ref 0.0–0.5)
Eosinophils Relative: 1 %
HCT: 32.6 % — ABNORMAL LOW (ref 39.0–52.0)
Hemoglobin: 10.8 g/dL — ABNORMAL LOW (ref 13.0–17.0)
Immature Granulocytes: 0 %
Lymphocytes Relative: 26 %
Lymphs Abs: 1 10*3/uL (ref 0.7–4.0)
MCH: 30.1 pg (ref 26.0–34.0)
MCHC: 33.1 g/dL (ref 30.0–36.0)
MCV: 90.8 fL (ref 80.0–100.0)
Monocytes Absolute: 0.4 10*3/uL (ref 0.1–1.0)
Monocytes Relative: 10 %
Neutro Abs: 2.5 10*3/uL (ref 1.7–7.7)
Neutrophils Relative %: 62 %
Platelets: 237 10*3/uL (ref 150–400)
RBC: 3.59 MIL/uL — ABNORMAL LOW (ref 4.22–5.81)
RDW: 16.8 % — ABNORMAL HIGH (ref 11.5–15.5)
Smear Review: NORMAL
WBC: 4 10*3/uL (ref 4.0–10.5)
nRBC: 0 % (ref 0.0–0.2)

## 2020-09-29 LAB — I-STAT VENOUS BLOOD GAS, ED
Acid-base deficit: 1 mmol/L (ref 0.0–2.0)
Bicarbonate: 23.1 mmol/L (ref 20.0–28.0)
Calcium, Ion: 1.18 mmol/L (ref 1.15–1.40)
HCT: 32 % — ABNORMAL LOW (ref 39.0–52.0)
Hemoglobin: 10.9 g/dL — ABNORMAL LOW (ref 13.0–17.0)
O2 Saturation: 91 %
Patient temperature: 98.3
Potassium: 3.9 mmol/L (ref 3.5–5.1)
Sodium: 139 mmol/L (ref 135–145)
TCO2: 24 mmol/L (ref 22–32)
pCO2, Ven: 35.1 mmHg — ABNORMAL LOW (ref 44.0–60.0)
pH, Ven: 7.426 (ref 7.250–7.430)
pO2, Ven: 58 mmHg — ABNORMAL HIGH (ref 32.0–45.0)

## 2020-09-29 LAB — TROPONIN I (HIGH SENSITIVITY): Troponin I (High Sensitivity): 31 ng/L — ABNORMAL HIGH (ref <18)

## 2020-09-29 LAB — LIPASE, BLOOD: Lipase: 22 U/L (ref 11–51)

## 2020-09-29 LAB — BRAIN NATRIURETIC PEPTIDE: B Natriuretic Peptide: 1366.4 pg/mL — ABNORMAL HIGH (ref 0.0–100.0)

## 2020-09-29 MED ORDER — IOHEXOL 350 MG/ML SOLN
100.0000 mL | Freq: Once | INTRAVENOUS | Status: AC | PRN
  Administered 2020-09-29: 16:00:00 100 mL via INTRAVENOUS

## 2020-09-29 MED ORDER — FUROSEMIDE 10 MG/ML IJ SOLN
40.0000 mg | Freq: Once | INTRAMUSCULAR | Status: AC
  Administered 2020-09-29: 17:00:00 40 mg via INTRAVENOUS
  Filled 2020-09-29: qty 4

## 2020-09-29 MED ORDER — FUROSEMIDE 40 MG PO TABS: 40.0000 mg | ORAL_TABLET | Freq: Every day | ORAL | 0 refills | Status: DC

## 2020-09-29 NOTE — Discharge Instructions (Signed)
You were seen in the emergency department today for shortness of breath. Your labs and imaging are concerning for a degree of heart failure with fluid buildup. We have given you an IV diuretic (a medicine to help remove the fluid) in the emergency department and are sending you home with this medication, Lasix, to start taking tomorrow morning. Please take 1 tablet daily for the next 5 days. Please follow-up very closely with cardiology, we have given you our [facility] cardiology office phone number as well as our main office in [place], please call tomorrow for the closest appointment. We have also sent a referral to help facilitate this as well.  We have prescribed you new medication(s) today. Discuss the medications prescribed today with your pharmacist as they can have adverse effects and interactions with your other medicines including over the counter and prescribed medications. Seek medical evaluation if you start to experience new or abnormal symptoms after taking one of these medicines, seek care immediately if you start to experience difficulty breathing, feeling of your throat closing, facial swelling, or rash as these could be indications of a more serious allergic reaction  Your CT scans showed findings of your known cancer, your liver function tests were mildly elevated and have been in the past, please continue to follow-up with your oncology group and make them aware of your ER visit.  Return to the emergency department for any new or worsening symptoms including but not limited to trouble breathing, chest pain, passing out, fever, coughing up blood, or any other concerns.

## 2020-09-29 NOTE — ED Triage Notes (Signed)
Sob since last night. Hx of pancreatic cancer. Sob started on and off after getting chemo 6 weeks ago.

## 2020-09-29 NOTE — ED Provider Notes (Signed)
[facility] EMERGENCY DEPARTMENT Provider Note   CSN: [id number] Arrival date & time: 09/29/20  1419     History Chief Complaint  Patient presents with  . Shortness of Breath    Brandon Schmitt is a 59 y.o. male with a hx of tobacco abuse, pancreatic cancer, alcohol abuse who presents to the emergency department with complaints of shortness of breath that acutely worsened yesterday.  Patient states that he has had problems with intermittent dyspnea since a chemotherapy session 6 weeks prior.  He states that he does get short of breath daily, it is worse at night, however yesterday into today it has seemed significantly worse.  He feels his breathing is labored.  Other than nighttime when he is trying to sleep being worse, no other alleviating or aggravating factors.  He denies chest pain, abdominal pain, vomiting, diarrhea, difficulty urinating, fever, chills, cough, leg pain/swelling, hemoptysis, recent long travel, hormone use, or prior VTE.  He has had recent surgical interventions for his cancerous processes.  Per chart review for additional history: Patient has a history of pancreatic adenocarcinoma, he has undergone neoadjuvant therapy with FOLFIRINOX, has competed 7 cycles, last cycle developed severe respiratory distress requiring ED visit and last cycle has been deferred. Has had biliary stent placement. He is scheduled for Whipple procedure. He is followed by [facility] for his oncology care.   HPI     Past Medical History:  Diagnosis Date  . Cancer [facility])     Patient Active Problem List   Diagnosis Date Noted  . Total bilirubin, elevated 02/13/2020  . Elevated transaminase level 02/13/2020  . Tobacco abuse 02/13/2020  . Alcohol use 02/13/2020  . Cholelithiasis 02/13/2020    Past Surgical History:  Procedure Laterality Date  . BILIARY STENT PLACEMENT N/A 02/19/2020   Procedure: BILIARY STENT PLACEMENT;  Surgeon: Arta Silence, MD;  Location: [facility];  Service:  Endoscopy;  Laterality: N/A;  . ENDOSCOPIC RETROGRADE CHOLANGIOPANCREATOGRAPHY (ERCP) WITH PROPOFOL N/A 02/19/2020   Procedure: ENDOSCOPIC RETROGRADE CHOLANGIOPANCREATOGRAPHY (ERCP) WITH PROPOFOL;  Surgeon: Arta Silence, MD;  Location: [facility];  Service: Endoscopy;  Laterality: N/A;  . ENDOSCOPIC RETROGRADE CHOLANGIOPANCREATOGRAPHY (ERCP) WITH PROPOFOL N/A 02/19/2020   Procedure: ENDOSCOPIC RETROGRADE CHOLANGIOPANCREATOGRAPHY (ERCP) WITH PROPOFOL;  Surgeon: Clarene Essex, MD;  Location: [facility];  Service: Endoscopy;  Laterality: N/A;  EUS FIRST THEN ERCP  . ESOPHAGOGASTRODUODENOSCOPY (EGD) WITH PROPOFOL N/A 02/19/2020   Procedure: ESOPHAGOGASTRODUODENOSCOPY (EGD) WITH PROPOFOL;  Surgeon: Arta Silence, MD;  Location: [facility];  Service: Endoscopy;  Laterality: N/A;  . EUS N/A 02/19/2020   Procedure: UPPER ENDOSCOPIC ULTRASOUND (EUS) LINEAR;  Surgeon: Arta Silence, MD;  Location: [facility];  Service: Endoscopy;  Laterality: N/A;  EUS FIRST THEN ERCP with DR MAGOD  . FINE NEEDLE ASPIRATION N/A 02/19/2020   Procedure: FINE NEEDLE ASPIRATION (FNA) LINEAR;  Surgeon: Arta Silence, MD;  Location: [facility];  Service: Endoscopy;  Laterality: N/A;  . SPHINCTEROTOMY  02/19/2020   Procedure: SPHINCTEROTOMY;  Surgeon: Arta Silence, MD;  Location: [facility];  Service: Endoscopy;;       No family history on file.  Social History   Tobacco Use  . Smoking status: Current Every Day Smoker    Packs/day: 0.00    Years: 25.00    Pack years: 0.00    Types: Cigarettes  . Smokeless tobacco: Never Used  . Tobacco comment: 2 cigs per day per pt  Substance Use Topics  . Alcohol use: Yes    Comment: couple of drinks in a  couple of months  . Drug use: Never    Home Medications Prior to Admission medications   Medication Sig Start Date End Date Taking? Authorizing Provider  Multiple Vitamins-Minerals (MULTIVITAMIN WITH MINERALS) tablet Take 1 tablet by mouth daily.   Yes  [provider]    Allergies    Patient has no known allergies.  Review of Systems   Review of Systems  Constitutional: Negative for chills and fever.  HENT: Negative for congestion, ear pain and sore throat.   Respiratory: Positive for shortness of breath. Negative for cough.   Cardiovascular: Negative for chest pain and leg swelling.  Gastrointestinal: Negative for abdominal pain, blood in stool, constipation, diarrhea and vomiting.  Genitourinary: Negative for difficulty urinating and dysuria.  Neurological: Negative for syncope.  All other systems reviewed and are negative.   Physical Exam Updated Vital Signs BP 138/89   Pulse 92   Temp 98.3 F (36.8 C) (Oral)   Resp 18   Ht 6\' 3"  (1.905 m)   Wt 83.9 kg   SpO2 100%   BMI 23.12 kg/m   Physical Exam Vitals and nursing note reviewed.  Constitutional:      General: He is not in acute distress.    Appearance: He is well-developed. He is not toxic-appearing.  HENT:     Head: Normocephalic and atraumatic.  Eyes:     General:        Right eye: No discharge.        Left eye: No discharge.     Conjunctiva/sclera: Conjunctivae normal.  Cardiovascular:     Rate and Rhythm: Normal rate and regular rhythm.  Pulmonary:     Effort: Tachypnea present. No respiratory distress.     Breath sounds: No wheezing or rales.     Comments: Somewhat poor air movement noted. Abdominal:     General: There is no distension.     Palpations: Abdomen is soft.     Tenderness: There is no abdominal tenderness.  Musculoskeletal:     Cervical back: Neck supple.     Right lower leg: No tenderness. No edema.     Left lower leg: No tenderness. No edema.  Skin:    General: Skin is warm and dry.     Findings: No rash.  Neurological:     Mental Status: He is alert.     Comments: Clear speech.   Psychiatric:        Behavior: Behavior normal.     ED Results / Procedures / Treatments   Labs (all labs ordered are listed, but only  abnormal results are displayed) Labs Reviewed  CBC WITH DIFFERENTIAL/PLATELET - Abnormal; Notable for the following components:      Result Value   RBC 3.59 (*)    Hemoglobin 10.8 (*)    HCT 32.6 (*)    RDW 16.8 (*)    All other components within normal limits  COMPREHENSIVE METABOLIC PANEL - Abnormal; Notable for the following components:   Sodium 133 (*)    CO2 21 (*)    Glucose, Bld 118 (*)    Calcium 8.6 (*)    AST 171 (*)    ALT 150 (*)    Alkaline Phosphatase 313 (*)    Total Bilirubin 2.0 (*)    All other components within normal limits  BRAIN NATRIURETIC PEPTIDE - Abnormal; Notable for the following components:   B Natriuretic Peptide 1,366.4 (*)    All other components within normal limits  I-STAT VENOUS BLOOD  GAS, ED - Abnormal; Notable for the following components:   pCO2, Ven 35.1 (*)    pO2, Ven 58.0 (*)    HCT 32.0 (*)    Hemoglobin 10.9 (*)    All other components within normal limits  TROPONIN I (HIGH SENSITIVITY) - Abnormal; Notable for the following components:   Troponin I (High Sensitivity) 31 (*)    All other components within normal limits  LIPASE, BLOOD    EKG EKG Interpretation  Date/Time:  Tuesday September 29 2020 14:32:29 EST Ventricular Rate:  90 PR Interval:  160 QRS Duration: 118 QT Interval:  402 QTC Calculation: 491 R Axis:   -32 Text Interpretation: Sinus rhythm with occasional Premature ventricular complexes Possible Left atrial enlargement Left axis deviation Left ventricular hypertrophy with QRS widening ( Cornell product ) T wave abnormality, consider lateral ischemia Prolonged QT Abnormal ECG Sinus rhythm, PVC, lateral T wave changes, no previous Confirmed by Coralee Pesa [phone]) on 09/29/2020 2:48:59 PM   Radiology DG Chest 2 View  Result Date: 09/29/2020 CLINICAL DATA:  Shortness of breath since last evening. EXAM: CHEST - 2 VIEW COMPARISON:  None FINDINGS: The heart is within normal limits in size. Mild tortuosity of the  thoracic aorta. The right IJ Port-A-Cath is in good position. No complicating features. Perihilar vascular congestion interstitial prominence could suggest pulmonary edema. Streaky basilar atelectasis versus early infiltrates. No pleural effusions. The bony thorax is intact. IMPRESSION: 1. Perihilar vascular congestion and interstitial prominence could suggest pulmonary edema. 2. Streaky bibasilar atelectasis versus early infiltrates. Electronically Signed   By: Rudie Meyer M.D.   On: 09/29/2020 16:19   CT Angio Chest PE W and/or Wo Contrast  Result Date: 09/29/2020 CLINICAL DATA:  Abdominal distension, pulmonary embolus suspected. EXAM: CT ANGIOGRAPHY CHEST CT ABDOMEN AND PELVIS WITH CONTRAST TECHNIQUE: Multidetector CT imaging of the chest was performed using the standard protocol during bolus administration of intravenous contrast. Multiplanar CT image reconstructions and MIPs were obtained to evaluate the vascular anatomy. Multidetector CT imaging of the abdomen and pelvis was performed using the standard protocol during bolus administration of intravenous contrast. CONTRAST:  OMNIPAQUE IOHEXOL 350 MG/ML SOLN COMPARISON:  None. FINDINGS: CTA CHEST FINDINGS Cardiovascular: Satisfactory opacification of the pulmonary arteries to the segmental level. No evidence of pulmonary embolism. Global cardiomegaly. No significant pericardial effusion. The thoracic aorta is normal in caliber. Mild atherosclerotic plaque of the thoracic aorta. At least mild three-vessel coronary artery calcifications. Mediastinum/Nodes: Several prominent mediastinal lymph nodes are noted. Enlarged right hilar lymph node measuring up to 1.6 cm (8:127). Prominent left hilar lymph node. No enlarged mediastinal or axillary lymph nodes. Trace debris within noted within the trachea. Otherwise thyroid gland, trachea, and esophagus demonstrate no significant findings. Lungs/Pleura: No focal consolidation. No pulmonary nodule. No pulmonary  mass. Trace to small volume bilateral, right greater than left, pleural effusions. No pneumothorax. Musculoskeletal: No abdominal wall hernia or abnormality. No suspicious lytic or blastic osseous lesions. No acute displaced fracture. Multilevel degenerative changes of the spine. Review of the MIP images confirms the above findings. CT ABDOMEN and PELVIS FINDINGS Hepatobiliary: Heterogeneous hepatic parenchyma. Subcapsular 4 mm hyperdense lesion (4:23). No gallstones, gallbladder wall thickening, or pericholecystic fluid. No biliary dilatation. Common bile duct stent in grossly appropriate position with associated expected pneumobilia. No portal venous gas peripherally noted. Pancreas: Diffusely atrophic. Difficult to visualize/measure proximal pancreatic mass (4:28) with associated hypodensity within the uncinate process measuring 6 mm (4:31) and within the of the pancreas measuring 8 mm (4:26)  may correlate to enhancing mass lesion noted on MR abdomen 02/13/2020. Otherwise normal pancreatic contour. No surrounding inflammatory changes. No main pancreatic ductal dilatation. Spleen: Normal in size without focal abnormality. Adrenals/Urinary Tract: No adrenal nodule bilaterally. Bilateral kidneys enhance symmetrically. Punctate calcified stone within the superior pole of the right kidney. No hydronephrosis. No hydroureter. On delayed imaging, there is no urothelial wall thickening and there are no filling defects in the opacified portions of the bilateral collecting systems or ureters. The urinary bladder is mildly distended with urine and grossly unremarkable. Stomach/Bowel: Stomach is within normal limits. No evidence of small bowel wall thickening or dilatation. Mild circumferential colonic wall thickening with associated mucosal hyperenhancement and pericolonic fat stranding. Appendix appears normal. Vascular/Lymphatic: The main portal, splenic, superior mesenteric veins are patent. No abdominal aorta or iliac  aneurysm. Severe calcified and noncalcified atherosclerotic plaque of the aorta and its branches. Suggestion of interval development of retroperitoneal lymphadenopathy with as an example a left periaortic lymph node (4:41) measuring 1 cm. No pelvic or inguinal lymphadenopathy. Reproductive: Prostate is unremarkable. Other: Trace perihepatic simple free fluid. No intraperitoneal free gas. No organized fluid collection. Musculoskeletal: Small simple free fluid in fat containing umbilical hernia with an abdominal defect of 0.6 cm. No suspicious lytic or blastic osseous lesions. No acute displaced fracture. Multilevel degenerative changes of the spine. Review of the MIP images confirms the above findings. IMPRESSION: 1. No pulmonary embolus. 2. Bilateral trace to small volume pleural effusions. 3. Mild pancolitis. Differential diagnosis of etiology includes infection, inflammation, ischemia. 4. Heterogeneous hepatic parenchyma with underlying lesion not excluded. Interval development of a subcapsular 4 mm hyperdense lesion. Limited evaluation on this single phase contrast study. Consider MRI liver protocol for further evaluation. 5. Difficult to visualize/measure known proximal pancreatic mass. Lesion noted on MR abdomen 02/13/2020. Associated common bile duct stent in grossly appropriate position. 6. Suggestion of interval development of retroperitoneal lymphadenopathy. 7. Trace simple ascites. 8. Nonobstructive punctate right nephrolithiasis. 9.  Aortic Atherosclerosis (ICD10-I70.0). Electronically Signed   By: Iven Finn M.D.   On: 09/29/2020 17:10   CT ABDOMEN PELVIS W CONTRAST  Result Date: 09/29/2020 CLINICAL DATA:  Abdominal distension, pulmonary embolus suspected. EXAM: CT ANGIOGRAPHY CHEST CT ABDOMEN AND PELVIS WITH CONTRAST TECHNIQUE: Multidetector CT imaging of the chest was performed using the standard protocol during bolus administration of intravenous contrast. Multiplanar CT image  reconstructions and MIPs were obtained to evaluate the vascular anatomy. Multidetector CT imaging of the abdomen and pelvis was performed using the standard protocol during bolus administration of intravenous contrast. CONTRAST:  115mL OMNIPAQUE IOHEXOL 350 MG/ML SOLN COMPARISON:  None. FINDINGS: CTA CHEST FINDINGS Cardiovascular: Satisfactory opacification of the pulmonary arteries to the segmental level. No evidence of pulmonary embolism. Global cardiomegaly. No significant pericardial effusion. The thoracic aorta is normal in caliber. Mild atherosclerotic plaque of the thoracic aorta. At least mild three-vessel coronary artery calcifications. Mediastinum/Nodes: Several prominent mediastinal lymph nodes are noted. Enlarged right hilar lymph node measuring up to 1.6 cm (8:127). Prominent left hilar lymph node. No enlarged mediastinal or axillary lymph nodes. Trace debris within noted within the trachea. Otherwise thyroid gland, trachea, and esophagus demonstrate no significant findings. Lungs/Pleura: No focal consolidation. No pulmonary nodule. No pulmonary mass. Trace to small volume bilateral, right greater than left, pleural effusions. No pneumothorax. Musculoskeletal: No abdominal wall hernia or abnormality. No suspicious lytic or blastic osseous lesions. No acute displaced fracture. Multilevel degenerative changes of the spine. Review of the MIP images confirms the above findings. CT  ABDOMEN and PELVIS FINDINGS Hepatobiliary: Heterogeneous hepatic parenchyma. Subcapsular 4 mm hyperdense lesion (4:23). No gallstones, gallbladder wall thickening, or pericholecystic fluid. No biliary dilatation. Common bile duct stent in grossly appropriate position with associated expected pneumobilia. No portal venous gas peripherally noted. Pancreas: Diffusely atrophic. Difficult to visualize/measure proximal pancreatic mass (4:28) with associated hypodensity within the uncinate process measuring 6 mm (4:31) and within the of  the pancreas measuring 8 mm (4:26) may correlate to enhancing mass lesion noted on MR abdomen 02/13/2020. Otherwise normal pancreatic contour. No surrounding inflammatory changes. No main pancreatic ductal dilatation. Spleen: Normal in size without focal abnormality. Adrenals/Urinary Tract: No adrenal nodule bilaterally. Bilateral kidneys enhance symmetrically. Punctate calcified stone within the superior pole of the right kidney. No hydronephrosis. No hydroureter. On delayed imaging, there is no urothelial wall thickening and there are no filling defects in the opacified portions of the bilateral collecting systems or ureters. The urinary bladder is mildly distended with urine and grossly unremarkable. Stomach/Bowel: Stomach is within normal limits. No evidence of small bowel wall thickening or dilatation. Mild circumferential colonic wall thickening with associated mucosal hyperenhancement and pericolonic fat stranding. Appendix appears normal. Vascular/Lymphatic: The main portal, splenic, superior mesenteric veins are patent. No abdominal aorta or iliac aneurysm. Severe calcified and noncalcified atherosclerotic plaque of the aorta and its branches. Suggestion of interval development of retroperitoneal lymphadenopathy with as an example a left periaortic lymph node (4:41) measuring 1 cm. No pelvic or inguinal lymphadenopathy. Reproductive: Prostate is unremarkable. Other: Trace perihepatic simple free fluid. No intraperitoneal free gas. No organized fluid collection. Musculoskeletal: Small simple free fluid in fat containing umbilical hernia with an abdominal defect of 0.6 cm. No suspicious lytic or blastic osseous lesions. No acute displaced fracture. Multilevel degenerative changes of the spine. Review of the MIP images confirms the above findings. IMPRESSION: 1. No pulmonary embolus. 2. Bilateral trace to small volume pleural effusions. 3. Mild pancolitis. Differential diagnosis of etiology includes infection,  inflammation, ischemia. 4. Heterogeneous hepatic parenchyma with underlying lesion not excluded. Interval development of a subcapsular 4 mm hyperdense lesion. Limited evaluation on this single phase contrast study. Consider MRI liver protocol for further evaluation. 5. Difficult to visualize/measure known proximal pancreatic mass. Lesion noted on MR abdomen 02/13/2020. Associated common bile duct stent in grossly appropriate position. 6. Suggestion of interval development of retroperitoneal lymphadenopathy. 7. Trace simple ascites. 8. Nonobstructive punctate right nephrolithiasis. 9.  Aortic Atherosclerosis (ICD10-I70.0). Electronically Signed   By: Iven Finn M.D.   On: 09/29/2020 17:10    Procedures Procedures (including critical care time)  Medications Ordered in ED Medications  iohexol (OMNIPAQUE) 350 MG/ML injection 100 mL (100 mLs Intravenous Contrast Given 09/29/20 1614)  furosemide (LASIX) injection 40 mg (40 mg Intravenous Given 09/29/20 1721)    ED Course  I have reviewed the triage vital signs and the nursing notes.  Pertinent labs & imaging results that were available during my care of the patient were reviewed by me and considered in my medical decision making (see chart for details).    MDM Rules/Calculators/A&P                         Patient presents to the ED with complaints of dyspnea.  He is nontoxic, vitals within normal limits however on my exam he does appear somewhat tachypneic with labored breathing, however his SPO2 is 95 to 100% on room air, he does not appear to be in acute respiratory distress.  Additional history obtained:  Additional history obtained from chart review & nursing note review.   Lab Tests:  I Ordered, reviewed, and interpreted labs, which included:  CBC: Anemia similar to prior in care everywhere. CMP: Elevated LFTs and T bili similar to prior in our system, mildly worsened from most recent in care everywhere.  Renal function  preserved. Lipase: Within normal limits Troponin: Mildly elevated BNP notably elevated VBG: pH within normal limits  Imaging Studies ordered:  I ordered imaging studies which included CXR & CTA of the chest & CT A/P, I independently visualized and interpreted imaging which showed:  CXR:  1. Perihilar vascular congestion and interstitial prominence could suggest pulmonary edema. 2. Streaky bibasilar atelectasis versus early infiltrates  CT imaging: No PE, pleural effusions. Additional cancer related findings as above. Trace ascites.  Suspect mild troponin elevation is demand related, no STEMI. No PE on CT. No acute pneumonia. Other than dyspnea otherwise asymptomatic.   Overall findings are concerning for acute CHF.  Myself and attending Dr. Darl Householder discussed results and plan of care with the patient and his fiance at bedside, we discussed different options of care including diuresis with discharge home and cardiology follow-up as well as admission, patient would really prefer to be able to go home, he states he lives nearby and would come back if he were to get worse.  He is feeling better in the emergency department.  He was given 40 mg of IV Lasix with good diuresis response- has had 1000 cc of output.  His SPO2 is greater than 95% on room air, he does not appear to be in respiratory distress.  We will start him on 40 mg of daily Lasix with very close cardiology follow-up and extremely strict return precautions per discussion with patient & Dr. Darl Householder. We discussed results, treatment plan, need for follow-up, and return precautions with the patient. Provided opportunity for questions, patient confirmed understanding and is in agreement with plan.   This is a shared visit with supervising physician Dr. Darl Householder who has independently evaluated patient & provided guidance in evaluation/management/disposition, in agreement with care    Portions of this note were generated with Dragon dictation software.  Dictation errors may occur despite best attempts at proofreading.  Final Clinical Impression(s) / ED Diagnoses Final diagnoses:  Acute congestive heart failure, unspecified heart failure type [facility])    Rx / DC Orders ED Discharge Orders         Ordered    furosemide (LASIX) 40 MG tablet  Daily        09/29/20 1833    Ambulatory referral to Cardiology        09/29/20 1834           Leafy Kindle 09/29/20 1837    Drenda Freeze, MD 10/02/20 [phone]

## 2020-10-05 DIAGNOSIS — C801 Malignant (primary) neoplasm, unspecified: Secondary | ICD-10-CM | POA: Insufficient documentation

## 2020-10-06 NOTE — Progress Notes (Signed)
Cardiology Office Note:    Date:  10/07/2020   ID:  Brandon Schmitt, DOB 1961/06/27, MRN [id number]  PCP:  Patient, No Pcp Per  Cardiologist:  Shirlee More, MD   Referring MD: Amaryllis Dyke,*  ASSESSMENT:    1. Chronic heart failure, unspecified heart failure type ([place])   2. Pancreatic carcinoma ([place])    PLAN:    In order of problems listed above:  1. He clearly has congestive heart failure in the context of chemotherapy and has completed his regimen.  He is fluid overloaded although he is not short of breath and he resume his diuretic.  I asked to do labs including renal function proBNP and he declined he tells me he simply has had to me lab test recently.  Will address at next visit.  We discussed doing an echocardiogram to confirm cardiac dysfunction and guide therapy and if EF is reduced will need treatment including standards beta-blocker Entresto SGLT2 inhibitor without without MRA.  He agrees to be set up within the month and I will see him in the office afterwards. 2. Stable followed by managed [facility].  Next appointment 3 to 4 weeks after echocardiogram was performed   Medication Adjustments/Labs and Tests Ordered: Current medicines are reviewed at length with the patient today.  Concerns regarding medicines are outlined above.  Orders Placed This Encounter  Procedures  . ECHOCARDIOGRAM COMPLETE   Meds ordered this encounter  Medications  . furosemide (LASIX) 40 MG tablet    Sig: Take 1 tablet (40 mg total) by mouth daily.    Dispense:  90 tablet    Refill:  3     Chief Complaint  Patient presents with  . Follow-up  . Congestive Heart Failure    History of Present Illness:    Brandon Schmitt is a 60 y.o. male who is being seen today for the evaluation of heart failure at the request of Petrucelli, Glynda Jaeger,*.  He was seen in the emergency [facility] 09/29/2020 with shortness of breath.  Imaging showed findings suggestive of heart  failure.  He had no evidence of pulmonary embolism and his troponin was mildly elevated and  felt to be due to heart failure was treated with IV furosemide improvement discharge for outpatient follow-up. His BNP level was significantly elevated at 1366, arterial blood gas showed a PO2 of 58 on room air his troponin I high-sensitivity is elevated at 31 and mildly anemic with hemoglobin at 10.8 potassium 3.8 creatinine normal 0.82 and liver function tests were abnormal.  His EKG showed sinus tachycardia and was abnormal with LVH repolarization changes left atrial enlargement. He receives care at [facility] for his pancreatic adenocarcinoma Chart review shows a CT in November with abnormal lung with concern of interstitial edema. He was treated with and completed 7 cycles of FOLFIRINOX with anticipated subsequent surgical resection.  He was seen [facility] emergency room 07/23/2020 with acute decompensated heart failure requiring BiPAP and IV furosemide and declined hospitalization.  Their diagnosis was that he had acute drug toxicity due to his chemotherapeutic agent with heart failure.  His proBNP level was significantly elevated 2439 chest x-ray consistent with decompensated heart failure troponin T was 25.  It is that he would undergo robotic Whipple procedure 10/01/2020.  Despite his cancer he is a good quality of life remains very active with splitting wood yesterday.  Since he took a short course of diuretic he has had no further shortness of breath but he  does have peripheral edema.  He says as a child he was told by his mother he had an enlarged heart but he has no known history of heart disease congenital or rheumatic.  He has had no cardiology evaluation.  He has had no chest pain palpitation or syncope presently not short of breath or coughing.  Both ED visits were characterized by severe shortness of breath at rest and nonproductive cough.  He is aware that he has presumed chemotherapy  cardiotoxicity. At this time he has not scheduled for pancreatic resection and has not made up his mind whether or not to have it. Past Medical History:  Diagnosis Date  . Cancer [facility])     Past Surgical History:  Procedure Laterality Date  . BILIARY STENT PLACEMENT N/A 02/19/2020   Procedure: BILIARY STENT PLACEMENT;  Surgeon: Arta Silence, MD;  [facility];  Service: Endoscopy;  Laterality: N/A;  . ENDOSCOPIC RETROGRADE CHOLANGIOPANCREATOGRAPHY (ERCP) WITH PROPOFOL N/A 02/19/2020   Procedure: ENDOSCOPIC RETROGRADE CHOLANGIOPANCREATOGRAPHY (ERCP) WITH PROPOFOL;  Surgeon: Arta Silence, MD;  [facility];  Service: Endoscopy;  Laterality: N/A;  . ENDOSCOPIC RETROGRADE CHOLANGIOPANCREATOGRAPHY (ERCP) WITH PROPOFOL N/A 02/19/2020   Procedure: ENDOSCOPIC RETROGRADE CHOLANGIOPANCREATOGRAPHY (ERCP) WITH PROPOFOL;  Surgeon: Clarene Essex, MD;  [facility];  Service: Endoscopy;  Laterality: N/A;  EUS FIRST THEN ERCP  . ESOPHAGOGASTRODUODENOSCOPY (EGD) WITH PROPOFOL N/A 02/19/2020   Procedure: ESOPHAGOGASTRODUODENOSCOPY (EGD) WITH PROPOFOL;  Surgeon: Arta Silence, MD;  [facility];  Service: Endoscopy;  Laterality: N/A;  . EUS N/A 02/19/2020   Procedure: UPPER ENDOSCOPIC ULTRASOUND (EUS) LINEAR;  Surgeon: Arta Silence, MD;  [facility];  Service: Endoscopy;  Laterality: N/A;  EUS FIRST THEN ERCP with DR MAGOD  . FINE NEEDLE ASPIRATION N/A 02/19/2020   Procedure: FINE NEEDLE ASPIRATION (FNA) LINEAR;  Surgeon: Arta Silence, MD;  [facility];  Service: Endoscopy;  Laterality: N/A;  . SPHINCTEROTOMY  02/19/2020   Procedure: SPHINCTEROTOMY;  Surgeon: Arta Silence, MD;  [facility];  Service: Endoscopy;;    Current Medications: Current Meds  Medication Sig  . furosemide (LASIX) 40 MG tablet Take 1 tablet (40 mg total) by mouth daily.  . Multiple Vitamins-Minerals (MULTIVITAMIN WITH MINERALS) tablet Take 1 tablet by  mouth daily.     Allergies:   Patient has no known allergies.   Social History   Socioeconomic History  . Marital status: Single    Spouse name: Not on file  . Number of children: 5  . Years of education: Not on file  . Highest education level: Not on file  Occupational History  . Not on file  Tobacco Use  . Smoking status: Current Every Day Smoker    Packs/day: 0.00    Years: 25.00    Pack years: 0.00    Types: Cigarettes  . Smokeless tobacco: Never Used  . Tobacco comment: 2 cigs per day per pt  Substance and Sexual Activity  . Alcohol use: Yes    Comment: couple of drinks in a couple of months  . Drug use: Never  . Sexual activity: Not on file  Other Topics Concern  . Not on file  Social History Narrative  . Not on file   Social Determinants of Health   Financial Resource Strain: Not on file  Food Insecurity: Not on file  Transportation Needs: Not on file  Physical Activity: Not on file  Stress: Not on file  Social Connections: Not on file     Family History: The  patient's family history is negative for Heart disease and Diabetes.  ROS:   ROS Please see the history of present illness.     All other systems reviewed and are negative.  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   recent Labs: 09/29/2020: ALT 150; B Natriuretic Peptide 1,366.4; BUN 13; Creatinine, Ser 0.82; Hemoglobin 10.9; Platelets 237; Potassium 3.9; Sodium 139  Recent Lipid Panel No results found for: CHOL, TRIG, HDL, CHOLHDL, VLDL, LDLCALC, LDLDIRECT  Physical Exam:    VS:  BP 110/70   Pulse 100   Ht 6\' 2"  (1.88 m)   Wt 183 lb (83 kg)   SpO2 98%   BMI 23.50 kg/m     Wt Readings from Last 3 Encounters:  10/07/20 183 lb (83 kg)  09/29/20 185 lb (83.9 kg)  02/25/20 215 lb (97.5 kg)     GEN: He  does not look chronically ill or debilitated well nourished, well developed in no acute distress HEENT: Normal NECK: No JVD; No carotid bruits LYMPHATICS: No lymphadenopathy CARDIAC: RRR, no murmurs, rubs, gallops RESPIRATORY:  Clear to auscultation without rales, wheezing or rhonchi  ABDOMEN: Soft, non-tender, non-distended MUSCULOSKELETAL: Bilateral lower extremity 2-3+ pitting edema; No deformity  SKIN: Warm and dry NEUROLOGIC:  Alert and oriented x 3 PSYCHIATRIC:  Normal affect     Signed, 02/27/20, MD  10/07/2020 4:56 PM    [facility]

## 2020-10-07 ENCOUNTER — Ambulatory Visit: Payer: Managed Care, Other (non HMO) | Admitting: Cardiology

## 2020-10-07 ENCOUNTER — Encounter: Payer: Self-pay | Admitting: Cardiology

## 2020-10-07 ENCOUNTER — Other Ambulatory Visit: Payer: Self-pay

## 2020-10-07 VITALS — BP 110/70 | HR 100 | Ht 74.0 in | Wt 183.0 lb

## 2020-10-07 DIAGNOSIS — C259 Malignant neoplasm of pancreas, unspecified: Secondary | ICD-10-CM | POA: Diagnosis not present

## 2020-10-07 DIAGNOSIS — I509 Heart failure, unspecified: Secondary | ICD-10-CM | POA: Diagnosis not present

## 2020-10-07 MED ORDER — FUROSEMIDE 40 MG PO TABS: 40.0000 mg | ORAL_TABLET | Freq: Every day | ORAL | 3 refills | Status: DC

## 2020-10-07 NOTE — Patient Instructions (Signed)
Medication Instructions:  Your physician has recommended you make the following change in your medication:  START: Furosemide 40 mg take one tablet by mouth daily.   *If you need a refill on your cardiac medications before your next appointment, please call your pharmacy*   Lab Work: None If you have labs (blood work) drawn today and your tests are completely normal, you will receive your results only by: Marland Kitchen MyChart Message (if you have MyChart) OR . A paper copy in the mail If you have any lab test that is abnormal or we need to change your treatment, we will call you to review the results.   Testing/Procedures: Your physician has requested that you have an echocardiogram. Echocardiography is a painless test that uses sound waves to create images of your heart. It provides your doctor with information about the size and shape of your heart and how well your heart's chambers and valves are working. This procedure takes approximately one hour. There are no restrictions for this procedure.     Follow-Up: At [facility], you and your health needs are our priority.  As part of our continuing mission to provide you with exceptional heart care, we have created designated Provider Care Teams.  These Care Teams include your primary Cardiologist (physician) and Advanced Practice Providers (APPs -  Physician Assistants and Nurse Practitioners) who all work together to provide you with the care you need, when you need it.  We recommend signing up for the patient portal called "MyChart".  Sign up information is provided on this After Visit Summary.  MyChart is used to connect with patients for Virtual Visits (Telemedicine).  Patients are able to view lab/test results, encounter notes, upcoming appointments, etc.  Non-urgent messages can be sent to your provider as well.   To learn more about what you can do with MyChart, go to [email or web address].    Your next appointment:   2 week(s)  The  format for your next appointment:   In Person  Provider:   Norman Herrlich, MD   Other Instructions

## 2020-10-16 ENCOUNTER — Telehealth: Payer: Self-pay | Admitting: Cardiology

## 2020-10-16 ENCOUNTER — Other Ambulatory Visit: Payer: Self-pay

## 2020-10-16 DIAGNOSIS — Z79899 Other long term (current) drug therapy: Secondary | ICD-10-CM

## 2020-10-16 NOTE — Telephone Encounter (Signed)
I think would be helpful if he could measure his blood pressure at home and record for [place] to be sure he is not having episodes of hypotension.  Lets give him a holiday no diuretic through the weekend and then resume on Monday Wednesday and Friday next week.  I would like him to come in and have lab work checked BMP proBNP in view of the symptoms he had declined last visit.

## 2020-10-16 NOTE — Telephone Encounter (Signed)
Patient is calling in because he states that since starting his 40 mg Lasix daily he has been experiencing extreme fatigue.   Patient states that he gets very tired about 30 minutes after taking the medication and it lasts 3-4 hours before resolving.   Patients denies any weight gain, SOB, swelling but does report dizziness on occasion that typically subsides quickly. Patient states that he does not feel like he has been urinating more than before.   Patient is wondering if he can decrease his dose? Routing to Dr. Bettina Gavia for advisement.

## 2020-10-16 NOTE — Telephone Encounter (Signed)
Pt c/o medication issue:  1. Name of Medication:  furosemide (LASIX) 40 MG tablet  2. How are you currently taking this medication (dosage and times per day)?  As directed   3. Are you having a reaction (difficulty breathing--STAT)?  Patient states that the medication "knocks him out" after he takes it for about 3-4 hours. He states the last 3-4 days in a row when he takes the medication he gets really tired within 30 min of taking it. Dizzy in the morning on occasion   4. What is your medication issue?  Issue with medication listed above

## 2020-10-16 NOTE — Telephone Encounter (Signed)
Note I think would be helpful if he could measure his blood pressure at home and record for [place] to be sure he is not having episodes of hypotension.  Lets give him a holiday no diuretic through the weekend and then resume on Monday Wednesday and Friday next week.  I would like him to come in and have lab work checked BMP proBNP in view of the symptoms he had declined last visit.     Patient made aware of the above recommendations from Dr. Bettina Gavia. He does not currently have a BP cuff but will get one this weekend. He is aware to call back early next week with an update. Lab orders placed.

## 2020-10-30 ENCOUNTER — Ambulatory Visit (HOSPITAL_BASED_OUTPATIENT_CLINIC_OR_DEPARTMENT_OTHER): Payer: Managed Care, Other (non HMO)

## 2020-11-04 ENCOUNTER — Ambulatory Visit: Payer: Managed Care, Other (non HMO) | Admitting: Cardiology

## 2020-11-20 ENCOUNTER — Ambulatory Visit (HOSPITAL_BASED_OUTPATIENT_CLINIC_OR_DEPARTMENT_OTHER)
Admission: RE | Admit: 2020-11-20 | Discharge: 2020-11-20 | Disposition: A | Discharge status: Home / Self Care | Payer: Managed Care, Other (non HMO) | Source: Ambulatory Visit | Attending: Cardiology | Admitting: Cardiology

## 2020-11-20 ENCOUNTER — Other Ambulatory Visit: Payer: Self-pay

## 2020-11-20 ENCOUNTER — Telehealth: Payer: Self-pay

## 2020-11-20 DIAGNOSIS — I509 Heart failure, unspecified: Secondary | ICD-10-CM | POA: Insufficient documentation

## 2020-11-20 LAB — ECHOCARDIOGRAM COMPLETE
AR max vel: 1.4 cm2
AV Area VTI: 1.65 cm2
AV Area mean vel: 1.42 cm2
AV Mean grad: 3 mmHg
AV Peak grad: 6.4 mmHg
Ao pk vel: 1.26 m/s
Area-P 1/2: 9.85 cm2
Calc EF: 22 %
MV M vel: 4.59 m/s
MV Peak grad: 84.3 mmHg
Radius: 0.8 cm
S' Lateral: 6.19 cm
Single Plane A2C EF: 19.6 %
Single Plane A4C EF: 27 %

## 2020-11-20 NOTE — Telephone Encounter (Signed)
Per Dr. Bettina Gavia:  I tried to call Brandon Schmitt his EF is severely reduced and I suspected this with his congestive heart failure. I would like to get him started on treatment and if he is willing to start him on Toprol-XL 25 mg daily and valsartan 40 mg a day for 1 week then twice daily he needs to have a blood pressure cuff at home and needs to recheck and record his blood pressure twice a day and then see me in the office in about 2 weeks if he does not want to come to [place] if he could see Brandon Schmitt the next time she is at [facility]    I tried calling the patient just now but did not get an answer as well. Left message on patients voicemail to please return our call.

## 2020-11-28 NOTE — Progress Notes (Signed)
Cardiology Office Note:    Date:  11/30/2020   ID:  Brandon Schmitt, DOB 05/28/1961, MRN [id number]  PCP:  Patient, No Pcp Per  Cardiologist:  Shirlee More, MD    Referring MD: No ref. provider found    ASSESSMENT:    1. Chronic systolic heart failure ([place])   2. Pancreatic carcinoma ([place])    PLAN:    In order of problems listed above:  1. He is decompensated fluid overloaded severe LV dysfunction.He will  resume diuretic initiate Entresto we will check renal function proBNP today he will come back in a week for follow-up and recheck at that time.  We will attempt to get him on standard medical therapy beta-blocker next depending on renal function MRA and SGLT2 inhibitor. 2. Managed by oncology at [facility]   Next appointment: 1 week   Medication Adjustments/Labs and Tests Ordered: Current medicines are reviewed at length with the patient today.  Concerns regarding medicines are outlined above.  Orders Placed This Encounter  Procedures  . Brain natriuretic peptide  . Pro b natriuretic peptide (BNP)[id number])   No orders of the defined types were placed in this encounter.   Chief Complaint  Patient presents with  . Follow-up  . Congestive Heart Failure    History of Present Illness:    Brandon Schmitt is a 60 y.o. male with a hx of pancreatic adenocarcinoma last seen 10/07/2020 with decompensated heart failure following an ED visit [place].  Compliance with diet, lifestyle and medications: He had stopped taking his diuretic about a month ago  I tried calling him with the results of his echocardiogram when received I was unable to contact him and  my office brought him to the office today.  He does not feel well he has orthopnea he has had greater than 20 pound weight gain he is increasingly fatigued and short of breath and he has edema to his umbilicus. I reviewed his echo showing severe LV dysfunction. We will start drug therapy with  Entresto today check renal function proBNP place him back on furosemide twice a day for 2 days then daily and see him back in the office in 1 week. He understands he has a severe cardiomyopathy. He has not had chest pain palpitation or syncope.  He receives care at [facility] for his pancreatic adenocarcinoma Chart review shows a CT in November with abnormal lung with concern of interstitial edema. He was treated with and completed 7 cycles of FOLFIRINOX with anticipated subsequent surgical resection.   He was seen [facility] emergency room 07/23/2020 with acute decompensated heart failure requiring BiPAP and IV furosemide and declined hospitalization.  Their diagnosis was that he had acute drug toxicity due to his chemotherapeutic agent with heart failure.  His proBNP level was significantly elevated 2439 chest x-ray consistent with decompensated heart failure troponin T was 25.  It is that he would undergo robotic Whipple procedure 10/01/2020.  After his last visit he underwent an echocardiogram performed 11/20/2020 showing severe left ventricular dysfunction EF 25 to 30% with diffuse hypokinesia left ventricle was dilated he had elevated left atrial and left ventricular diastolic pressures and GLS was severely abnormal -4.2%.  There is mild right ventricular dysfunction and moderate enlargement both atrium were severely enlarged moderate secondary functional mitral regurgitation was present mild to moderate tricuspid regurgitation and the inferior vena cava was dilated failing to collapse with elevated right atrial pressure Past Medical History:  Diagnosis Date  . Cancer (  [facility])     Past Surgical History:  Procedure Laterality Date  . BILIARY STENT PLACEMENT N/A 02/19/2020   Procedure: BILIARY STENT PLACEMENT;  Surgeon: Arta Silence, MD;  [facility];  Service: Endoscopy;  Laterality: N/A;  . ENDOSCOPIC RETROGRADE CHOLANGIOPANCREATOGRAPHY (ERCP) WITH PROPOFOL N/A 02/19/2020    Procedure: ENDOSCOPIC RETROGRADE CHOLANGIOPANCREATOGRAPHY (ERCP) WITH PROPOFOL;  Surgeon: Arta Silence, MD;  [facility];  Service: Endoscopy;  Laterality: N/A;  . ENDOSCOPIC RETROGRADE CHOLANGIOPANCREATOGRAPHY (ERCP) WITH PROPOFOL N/A 02/19/2020   Procedure: ENDOSCOPIC RETROGRADE CHOLANGIOPANCREATOGRAPHY (ERCP) WITH PROPOFOL;  Surgeon: Clarene Essex, MD;  [facility];  Service: Endoscopy;  Laterality: N/A;  EUS FIRST THEN ERCP  . ESOPHAGOGASTRODUODENOSCOPY (EGD) WITH PROPOFOL N/A 02/19/2020   Procedure: ESOPHAGOGASTRODUODENOSCOPY (EGD) WITH PROPOFOL;  Surgeon: Arta Silence, MD;  [facility];  Service: Endoscopy;  Laterality: N/A;  . EUS N/A 02/19/2020   Procedure: UPPER ENDOSCOPIC ULTRASOUND (EUS) LINEAR;  Surgeon: Arta Silence, MD;  [facility];  Service: Endoscopy;  Laterality: N/A;  EUS FIRST THEN ERCP with DR MAGOD  . FINE NEEDLE ASPIRATION N/A 02/19/2020   Procedure: FINE NEEDLE ASPIRATION (FNA) LINEAR;  Surgeon: Arta Silence, MD;  [facility];  Service: Endoscopy;  Laterality: N/A;  . SPHINCTEROTOMY  02/19/2020   Procedure: SPHINCTEROTOMY;  Surgeon: Arta Silence, MD;  [facility];  Service: Endoscopy;;    Current Medications: Current Meds  Medication Sig  . furosemide (LASIX) 40 MG tablet Take 1 tablet (40 mg total) by mouth daily.  . Multiple Vitamins-Minerals (MULTIVITAMIN WITH MINERALS) tablet Take 1 tablet by mouth daily.  . Pancrelipase, Lip-Prot-Amyl, (PANCREAZE) 37000-97300 units CPEP Take by mouth.     Allergies:   Irinotecan   Social History   Socioeconomic History  . Marital status: Single    Spouse name: Not on file  . Number of children: 5  . Years of education: Not on file  . Highest education level: Not on file  Occupational History  . Not on file  Tobacco Use  . Smoking status: Current Every Day Smoker    Packs/day: 0.00    Years: 25.00    Pack years: 0.00    Types: Cigarettes  .  Smokeless tobacco: Never Used  . Tobacco comment: 2 cigs per day per pt  Substance and Sexual Activity  . Alcohol use: Yes    Comment: couple of drinks in a couple of months  . Drug use: Never  . Sexual activity: Not on file  Other Topics Concern  . Not on file  Social History Narrative  . Not on file   Social Determinants of Health   Financial Resource Strain: Not on file  Food Insecurity: Not on file  Transportation Needs: Not on file  Physical Activity: Not on file  Stress: Not on file  Social Connections: Not on file     Family History: The patient's family history is negative for Heart disease and Diabetes. ROS:   Please see the history of present illness.    All other systems reviewed and are negative.  EKGs/Labs/Other Studies Reviewed:    The following studies were reviewed today:    Recent Labs: 09/29/2020: ALT 150; B Natriuretic Peptide 1,366.4; BUN 13; Creatinine, Ser 0.82; Hemoglobin 10.9; Platelets 237; Potassium 3.9; Sodium 139  Recent Lipid Panel No results found for: CHOL, TRIG, HDL, CHOLHDL, VLDL, LDLCALC, LDLDIRECT  Physical Exam:    VS:  BP (!) 136/98   Pulse 88   Ht 6\' 2"  (1.88 m)   Wt  205 lb (93 kg)   SpO2 97%   BMI 26.32 kg/m     Wt Readings from Last 3 Encounters:  11/30/20 205 lb (93 kg)  10/07/20 183 lb (83 kg)  09/29/20 185 lb (83.9 kg)     GEN: He looks chronically ill well nourished, well developed in no acute distress HEENT: Normal NECK: No JVD; No carotid bruits LYMPHATICS: No lymphadenopathy CARDIAC: S3 present RRR, no murmurs, rubs, gallops RESPIRATORY:  Clear to auscultation without rales, wheezing or rhonchi  ABDOMEN: Soft, non-tender, non-distended MUSCULOSKELETAL: Bilateral pitting to the umbilicus edema; No deformity  SKIN: Warm and dry NEUROLOGIC:  Alert and oriented x 3 PSYCHIATRIC:  Normal affect    Signed, Shirlee More, MD  11/30/2020 5:06 PM    [facility]

## 2020-11-30 ENCOUNTER — Encounter: Payer: Self-pay | Admitting: Cardiology

## 2020-11-30 ENCOUNTER — Ambulatory Visit: Payer: Managed Care, Other (non HMO) | Admitting: Cardiology

## 2020-11-30 ENCOUNTER — Telehealth: Payer: Self-pay

## 2020-11-30 ENCOUNTER — Other Ambulatory Visit: Payer: Self-pay

## 2020-11-30 VITALS — BP 136/98 | HR 88 | Ht 74.0 in | Wt 205.0 lb

## 2020-11-30 DIAGNOSIS — C259 Malignant neoplasm of pancreas, unspecified: Secondary | ICD-10-CM | POA: Diagnosis not present

## 2020-11-30 DIAGNOSIS — I5022 Chronic systolic (congestive) heart failure: Secondary | ICD-10-CM

## 2020-11-30 MED ORDER — SACUBITRIL-VALSARTAN 24-26 MG PO TABS: 1.0000 | ORAL_TABLET | Freq: Two times a day (BID) | ORAL | 6 refills | Status: DC

## 2020-11-30 MED ORDER — FUROSEMIDE 40 MG PO TABS: 40.0000 mg | ORAL_TABLET | Freq: Every day | ORAL | 3 refills | Status: DC

## 2020-11-30 NOTE — Patient Instructions (Addendum)
Medication Instructions:  Your physician has recommended you make the following change in your medication:   Take furosemide 40 mg twice daily for 2 days then daily. Start Entresto 24/26 mg twice daily.  *If you need a refill on your cardiac medications before your next appointment, please call your pharmacy*   Lab Work: Your physician recommends that you have a BNP and proBNP done today in the office.  If you have labs (blood work) drawn today and your tests are completely normal, you will receive your results only by: Marland Kitchen MyChart Message (if you have MyChart) OR . A paper copy in the mail If you have any lab test that is abnormal or we need to change your treatment, we will call you to review the results.   Testing/Procedures: None ordered   Follow-Up: At [facility], you and your health needs are our priority.  As part of our continuing mission to provide you with exceptional heart care, we have created designated Provider Care Teams.  These Care Teams include your primary Cardiologist (physician) and Advanced Practice Providers (APPs -  Physician Assistants and Nurse Practitioners) who all work together to provide you with the care you need, when you need it.  We recommend signing up for the patient portal called "MyChart".  Sign up information is provided on this After Visit Summary.  MyChart is used to connect with patients for Virtual Visits (Telemedicine).  Patients are able to view lab/test results, encounter notes, upcoming appointments, etc.  Non-urgent messages can be sent to your provider as well.   To learn more about what you can do with MyChart, go to [email or web address].    Your next appointment:   1 week(s)  The format for your next appointment:   In Person  Provider:   Shirlee More, MD   Other Instructions Sacubitril; Valsartan Oral Tablets What is this medicine? SACUBITRIL; VALSARTAN (sak UE bi tril; val SAR tan) is a combination of a neprilysin  inhibitor and a an angiotensin II receptor blocker. It treats heart failure. This medicine may be used for other purposes; ask your health care provider or pharmacist if you have questions. COMMON BRAND NAME(S): Entresto What should I tell my health care provider before I take this medicine? They need to know if you have any of these conditions:  diabetes and take a medicine that contains aliskiren  high levels of potassium in the blood  kidney disease  liver disease  low blood pressure  an unusual or allergic reaction to sacubitril; valsartan, drugs called angiotensin converting enzyme (ACE) inhibitors, angiotensin II receptor blockers (ARBs), other medicines, foods, dyes, or preservatives  pregnant or trying to get pregnant  breast-feeding How should I use this medicine? Take this medicine by mouth. Take it as directed on the prescription label at the same time every day. You can take it with or without food. If it upsets your stomach, take it with food. Keep taking it unless your health care provider tells you to stop. Talk to your health care provider about the use of this drug in children. While it may be prescribed for children as young as 1 for selected conditions, precautions do apply. Overdosage: If you think you have taken too much of this medicine contact a poison control center or emergency room at once. NOTE: This medicine is only for you. Do not share this medicine with others. What if I miss a dose? If you miss a dose, take it as soon as you can. If  it is almost time for your next dose, take only that dose. Do not take double or extra doses. What may interact with this medicine? Do not take this medicine with any of the following medicines:  aliskiren if you have diabetes  angiotensin-converting enzyme (ACE) inhibitors, like benazepril, captopril, enalapril, fosinopril, lisinopril, or ramipril  tranylcypromine This medicine may also interact with the following  medicines:  angiotensin II receptor blockers (ARBs) like azilsartan, candesartan, eprosartan, irbesartan, losartan, olmesartan, telmisartan, or valsartan  celecoxib  lithium  NSAIDS, medicines for pain and inflammation, like ibuprofen or naproxen  potassium-sparing diuretics like amiloride, spironolactone, and triamterene  potassium supplements This list may not describe all possible interactions. Give your health care provider a list of all the medicines, herbs, non-prescription drugs, or dietary supplements you use. Also tell them if you smoke, drink alcohol, or use illegal drugs. Some items may interact with your medicine. What should I watch for while using this medicine? Tell your doctor or health care provider if your symptoms do not start to get better or if they get worse. Do not become pregnant while taking this medicine. Women should inform their health care provider if they wish to become pregnant or think they might be pregnant. There is a potential for serious harm to an unborn child. Talk to your health care provider for more information. You may get drowsy or dizzy. Do not drive, use machinery, or do anything that needs mental alertness until you know how this medicine affects you. Do not stand or sit up quickly, especially if you are an older patient. This reduces the risk of dizzy or fainting spells. Alcohol may interfere with the effects of this medicine. Avoid alcoholic drinks. Avoid salt substitutes unless you are told otherwise by your health care provider. What side effects may I notice from receiving this medicine? Side effects that you should report to your doctor or health care provider as soon as possible:  allergic reactions (skin rash, itching or hives; swelling of the face, lips, or tongue)  high potassium levels (chest pain; fast, irregular heartbeat; muscle weakness)  kidney injury (trouble passing urine or change in the amount of urine)  low blood pressure  (dizziness; feeling faint or lightheaded, falls; unusually weak or tired) Side effects that usually do not require medical attention (report to your doctor or health care provider if they continue or are bothersome):  cough This list may not describe all possible side effects. Call your doctor for medical advice about side effects. You may report side effects to FDA at 1-800-FDA-1088. Where should I keep my medicine? Keep out of the reach of children and pets. Store at room temperature between 20 and 25 degrees C (68 and 77 degrees F). Protect from moisture. Keep the container tightly closed. Get rid of any unused medicine after the expiration date. To get rid of medicines that are no longer needed or have expired:  Take the medicine to a take-back program. Check with your pharmacy or law enforcement to find a location.  If you cannot return the medicine, check the label or package insert to see if the medicine should be thrown out in the garbage or flushed down the toilet. If you are not sure, ask your health care provider. If it is safe to put it in the trash, empty the medicine out of the container. Mix the medicine with cat litter, dirt, coffee grounds, or other unwanted substance. Seal the mixture in a bag or container. Put it in  the trash. NOTE: This sheet is a summary. It may not cover all possible information. If you have questions about this medicine, talk to your doctor, pharmacist, or health care provider.  2021 Elsevier/Gold Standard (2019-11-25 11:23:32)

## 2020-11-30 NOTE — Telephone Encounter (Signed)
1 bottle of Entresto 24/26 mg given to pt per Dr. Bettina Gavia, Lot # [id number] EXP 4/24

## 2020-12-01 LAB — PRO B NATRIURETIC PEPTIDE: NT-Pro BNP: 4735 pg/mL — ABNORMAL HIGH (ref 0–210)

## 2020-12-01 LAB — BRAIN NATRIURETIC PEPTIDE: BNP: 1614.7 pg/mL — ABNORMAL HIGH (ref 0.0–100.0)

## 2020-12-04 LAB — BASIC METABOLIC PANEL
BUN/Creatinine Ratio: 12 (ref 9–20)
BUN: 11 mg/dL (ref 6–24)
CO2: 18 mmol/L — ABNORMAL LOW (ref 20–29)
Calcium: 8.8 mg/dL (ref 8.7–10.2)
Chloride: 106 mmol/L (ref 96–106)
Creatinine, Ser: 0.9 mg/dL (ref 0.76–1.27)
Glucose: 87 mg/dL (ref 65–99)
Potassium: 4 mmol/L (ref 3.5–5.2)
Sodium: 140 mmol/L (ref 134–144)
eGFR: 99 mL/min/{1.73_m2} (ref >59)

## 2020-12-04 LAB — SPECIMEN STATUS REPORT

## 2020-12-09 ENCOUNTER — Encounter: Payer: Self-pay | Admitting: Cardiology

## 2020-12-09 ENCOUNTER — Other Ambulatory Visit: Payer: Self-pay

## 2020-12-09 ENCOUNTER — Ambulatory Visit: Payer: Managed Care, Other (non HMO) | Admitting: Cardiology

## 2020-12-09 VITALS — BP 138/74 | HR 73 | Ht 74.0 in | Wt 182.0 lb

## 2020-12-09 DIAGNOSIS — C259 Malignant neoplasm of pancreas, unspecified: Secondary | ICD-10-CM | POA: Diagnosis not present

## 2020-12-09 DIAGNOSIS — I509 Heart failure, unspecified: Secondary | ICD-10-CM | POA: Insufficient documentation

## 2020-12-09 DIAGNOSIS — I5042 Chronic combined systolic (congestive) and diastolic (congestive) heart failure: Secondary | ICD-10-CM

## 2020-12-09 DIAGNOSIS — I420 Dilated cardiomyopathy: Secondary | ICD-10-CM | POA: Insufficient documentation

## 2020-12-09 HISTORY — DX: Dilated cardiomyopathy: I42.0

## 2020-12-09 HISTORY — DX: Heart failure, unspecified: I50.9

## 2020-12-09 NOTE — Progress Notes (Signed)
Cardiology Office Note:    Date:  12/09/2020   ID:  Brandon Schmitt, DOB [birth year], MRN [id number]  PCP:  Patient, No Pcp Per  Cardiologist:  Jenne Campus, MD    Referring MD: No ref. provider found   Chief Complaint  Patient presents with  . Follow-up  I am feeling much better  History of Present Illness:    Brandon Schmitt is a 60 y.o. male with past medical history significant for cardiomyopathy, he is left ventricle ejection fraction is in the neighborhood of 25 to 30%, also history of pancreatic adenocarcinoma.  He seen my partner just few weeks ago with decompensated congestive heart failure.  He was very appropriately initiated with diuretic as well as Entresto.  He comes for follow-up today with her intention to augment his medical therapy.  He feels dramatically better, he lost significant amount of weight he is swelling is almost completely gone, he does not have any paroxysmal nocturnal dyspnea overall he is doing quite well. We initiated conversation about doing EKG with potentially initiation of beta-blockade however he does not want to have EKG, we also talk about doing a Chem-7 to make sure his kidney function potassium is still within acceptable range she is very reluctant to have it done we had a long discussion about this.  He is upset about this entire situation because of nobody tells him anything.  He does not believe he does have a pancreatic cancer.  He said he goes to different doctors and they told him different story. Overall he is very happy that he is feeling better.  I was able to convince him to have Chem-7.  He will follow-up with [place] within the next 3 to 4 weeks.   Past Medical History:  Diagnosis Date  . Alcohol use 02/13/2020  . Blood clotting disorder ([place]) 07/06/2016  . Cancer ([place])   . Cholelithiasis 02/13/2020  . Elevated transaminase level 02/13/2020  . Pancreatic adenocarcinoma ([place]) 03/31/2020  . Tobacco abuse 02/13/2020  . Total bilirubin, elevated  02/13/2020    Past Surgical History:  Procedure Laterality Date  . BILIARY STENT PLACEMENT N/A 02/19/2020   Procedure: BILIARY STENT PLACEMENT;  Surgeon: Arta Silence, MD;  Location: [facility];  Service: Endoscopy;  Laterality: N/A;  . ENDOSCOPIC RETROGRADE CHOLANGIOPANCREATOGRAPHY (ERCP) WITH PROPOFOL N/A 02/19/2020   Procedure: ENDOSCOPIC RETROGRADE CHOLANGIOPANCREATOGRAPHY (ERCP) WITH PROPOFOL;  Surgeon: Arta Silence, MD;  Location: [facility];  Service: Endoscopy;  Laterality: N/A;  . ENDOSCOPIC RETROGRADE CHOLANGIOPANCREATOGRAPHY (ERCP) WITH PROPOFOL N/A 02/19/2020   Procedure: ENDOSCOPIC RETROGRADE CHOLANGIOPANCREATOGRAPHY (ERCP) WITH PROPOFOL;  Surgeon: Clarene Essex, MD;  Location: [facility];  Service: Endoscopy;  Laterality: N/A;  EUS FIRST THEN ERCP  . ESOPHAGOGASTRODUODENOSCOPY (EGD) WITH PROPOFOL N/A 02/19/2020   Procedure: ESOPHAGOGASTRODUODENOSCOPY (EGD) WITH PROPOFOL;  Surgeon: Arta Silence, MD;  Location: [facility];  Service: Endoscopy;  Laterality: N/A;  . EUS N/A 02/19/2020   Procedure: UPPER ENDOSCOPIC ULTRASOUND (EUS) LINEAR;  Surgeon: Arta Silence, MD;  Location: [facility];  Service: Endoscopy;  Laterality: N/A;  EUS FIRST THEN ERCP with DR MAGOD  . FINE NEEDLE ASPIRATION N/A 02/19/2020   Procedure: FINE NEEDLE ASPIRATION (FNA) LINEAR;  Surgeon: Arta Silence, MD;  Location: [facility];  Service: Endoscopy;  Laterality: N/A;  . SPHINCTEROTOMY  02/19/2020   Procedure: SPHINCTEROTOMY;  Surgeon: Arta Silence, MD;  Location: [facility];  Service: Endoscopy;;    Current Medications: Current Meds  Medication Sig  . furosemide (LASIX) 40 MG tablet Take 1 tablet (40 mg total)  by mouth daily.  . Multiple Vitamins-Minerals (MULTIVITAMIN WITH MINERALS) tablet Take 1 tablet by mouth daily.  . Pancrelipase, Lip-Prot-Amyl, (PANCREAZE) 37000-97300 units CPEP Take by mouth.  . sacubitril-valsartan (ENTRESTO) 24-26 MG Take 1 tablet by mouth 2 (two) times daily.      Allergies:   Irinotecan   Social History   Socioeconomic History  . Marital status: Single    Spouse name: Not on file  . Number of children: 5  . Years of education: Not on file  . Highest education level: Not on file  Occupational History  . Not on file  Tobacco Use  . Smoking status: Current Every Day Smoker    Packs/day: 0.00    Years: 25.00    Pack years: 0.00    Types: Cigarettes  . Smokeless tobacco: Never Used  . Tobacco comment: 2 cigs per day per pt  Substance and Sexual Activity  . Alcohol use: Yes    Comment: couple of drinks in a couple of months  . Drug use: Never  . Sexual activity: Not on file  Other Topics Concern  . Not on file  Social History Narrative  . Not on file   Social Determinants of Health   Financial Resource Strain: Not on file  Food Insecurity: Not on file  Transportation Needs: Not on file  Physical Activity: Not on file  Stress: Not on file  Social Connections: Not on file     Family History: The patient's family history is negative for Heart disease and Diabetes. ROS:   Please see the history of present illness.    All 14 point review of systems negative except as described per history of present illness  EKGs/Labs/Other Studies Reviewed:      Recent Labs: 09/29/2020: ALT 150; Hemoglobin 10.9; Platelets 237 11/30/2020: BNP 1,614.7; BUN 11; Creatinine, Ser 0.90; NT-Pro BNP 4,735; Potassium 4.0; Sodium 140  Recent Lipid Panel No results found for: CHOL, TRIG, HDL, CHOLHDL, VLDL, LDLCALC, LDLDIRECT  Physical Exam:    VS:  BP 138/74 (BP Location: Right Arm, Patient Position: Sitting)   Pulse 73   Ht 6\' 2"  (1.88 m)   Wt 182 lb (82.6 kg)   SpO2 99%   BMI 23.37 kg/m     Wt Readings from Last 3 Encounters:  12/09/20 182 lb (82.6 kg)  11/30/20 205 lb (93 kg)  10/07/20 183 lb (83 kg)     GEN:  Well nourished, well developed in no acute distress HEENT: Normal NECK: No JVD; No carotid bruits LYMPHATICS: No  lymphadenopathy CARDIAC: RRR, no murmurs, no rubs, no gallops RESPIRATORY:  Clear to auscultation without rales, wheezing or rhonchi  ABDOMEN: Soft, non-tender, non-distended MUSCULOSKELETAL:  No edema; No deformity  SKIN: Warm and dry LOWER EXTREMITIES: Minimal swelling NEUROLOGIC:  Alert and oriented x 3 PSYCHIATRIC:  Normal affect   ASSESSMENT:    1. Pancreatic adenocarcinoma ([place])   2. Dilated cardiomyopathy ([place])   3. Chronic combined systolic and diastolic congestive heart failure (HCC)    PLAN:    In order of problems listed above:  1. Pancreatic adenocarcinoma.  I do not think he believes in this diagnosis.  Was trying to talk to him back the discussion was fruitless. 2. Dilated cardiomyopathy with severely reduced left ventricle ejection fraction he is on Entresto as well as diuresis responded quite nicely to therapy seems to be compensated today.  I was able to convince him to have Chem-7 done we will do it. 3. Chronic systolic congestive  heart failure looks much better today.  Other difficult to manage because of I think lack of understanding from him what the problem is.  Will follow up with him and talk about augmenting his medical therapy.   Medication Adjustments/Labs and Tests Ordered: Current medicines are reviewed at length with the patient today.  Concerns regarding medicines are outlined above.  No orders of the defined types were placed in this encounter.  Medication changes: No orders of the defined types were placed in this encounter.   Signed, Park Liter, MD, [facility] 12/09/2020 3:38 PM    [place]

## 2020-12-09 NOTE — Patient Instructions (Signed)
Medication Instructions:  Your physician recommends that you continue on your current medications as directed. Please refer to the Current Medication list given to you today.  *If you need a refill on your cardiac medications before your next appointment, please call your pharmacy*   Lab Work: Your physician recommends that you return for lab work today: bmp   If you have labs (blood work) drawn today and your tests are completely normal, you will receive your results only by: Marland Kitchen MyChart Message (if you have MyChart) OR . A paper copy in the mail If you have any lab test that is abnormal or we need to change your treatment, we will call you to review the results.   Testing/Procedures: None   Follow-Up: At [facility], you and your health needs are our priority.  As part of our continuing mission to provide you with exceptional heart care, we have created designated Provider Care Teams.  These Care Teams include your primary Cardiologist (physician) and Advanced Practice Providers (APPs -  Physician Assistants and Nurse Practitioners) who all work together to provide you with the care you need, when you need it.  We recommend signing up for the patient portal called "MyChart".  Sign up information is provided on this After Visit Summary.  MyChart is used to connect with patients for Virtual Visits (Telemedicine).  Patients are able to view lab/test results, encounter notes, upcoming appointments, etc.  Non-urgent messages can be sent to your provider as well.   To learn more about what you can do with MyChart, go to [email or web address].    Your next appointment:   3 week(s)  The format for your next appointment:   In Person  Provider:   Jenne Campus, MD   Other Instructions

## 2020-12-10 ENCOUNTER — Telehealth: Payer: Self-pay | Admitting: Cardiology

## 2020-12-10 LAB — BASIC METABOLIC PANEL
BUN/Creatinine Ratio: 14 (ref 9–20)
BUN: 12 mg/dL (ref 6–24)
CO2: 24 mmol/L (ref 20–29)
Calcium: 9 mg/dL (ref 8.7–10.2)
Chloride: 103 mmol/L (ref 96–106)
Creatinine, Ser: 0.84 mg/dL (ref 0.76–1.27)
Glucose: 76 mg/dL (ref 65–99)
Potassium: 4.5 mmol/L (ref 3.5–5.2)
Sodium: 139 mmol/L (ref 134–144)
eGFR: 101 mL/min/{1.73_m2} (ref >59)

## 2020-12-10 NOTE — Telephone Encounter (Signed)
Patient is returning call to discuss lab results. 

## 2020-12-14 NOTE — Telephone Encounter (Signed)
Patient returning [facility] call

## 2020-12-14 NOTE — Telephone Encounter (Signed)
Called patient informed him of results.

## 2021-01-06 ENCOUNTER — Other Ambulatory Visit: Payer: Self-pay

## 2021-01-06 ENCOUNTER — Encounter: Payer: Self-pay | Admitting: Cardiology

## 2021-01-06 ENCOUNTER — Ambulatory Visit: Payer: Managed Care, Other (non HMO) | Admitting: Cardiology

## 2021-01-06 VITALS — BP 100/60 | HR 76 | Ht 74.0 in | Wt 169.0 lb

## 2021-01-06 DIAGNOSIS — I5042 Chronic combined systolic (congestive) and diastolic (congestive) heart failure: Secondary | ICD-10-CM | POA: Diagnosis not present

## 2021-01-06 DIAGNOSIS — I420 Dilated cardiomyopathy: Secondary | ICD-10-CM | POA: Diagnosis not present

## 2021-01-06 DIAGNOSIS — C259 Malignant neoplasm of pancreas, unspecified: Secondary | ICD-10-CM | POA: Diagnosis not present

## 2021-01-06 MED ORDER — ENTRESTO 24-26 MG PO TABS: 1.0000 | ORAL_TABLET | Freq: Two times a day (BID) | ORAL | 1 refills | Status: DC

## 2021-01-06 NOTE — Patient Instructions (Signed)
Medication Instructions:  Your physician has recommended you make the following change in your medication: Entresto 24/26 1 tablet twice a day *If you need a refill on your cardiac medications before your next appointment, please call your pharmacy*   Lab Work: None If you have labs (blood work) drawn today and your tests are completely normal, you will receive your results only by: Marland Kitchen MyChart Message (if you have MyChart) OR . A paper copy in the mail If you have any lab test that is abnormal or we need to change your treatment, we will call you to review the results.   Testing/Procedures: None   Follow-Up: At [facility], you and your health needs are our priority.  As part of our continuing mission to provide you with exceptional heart care, we have created designated Provider Care Teams.  These Care Teams include your primary Cardiologist (physician) and Advanced Practice Providers (APPs -  Physician Assistants and Nurse Practitioners) who all work together to provide you with the care you need, when you need it.  We recommend signing up for the patient portal called "MyChart".  Sign up information is provided on this After Visit Summary.  MyChart is used to connect with patients for Virtual Visits (Telemedicine).  Patients are able to view lab/test results, encounter notes, upcoming appointments, etc.  Non-urgent messages can be sent to your provider as well.   To learn more about what you can do with MyChart, go to [email or web address].    Your next appointment:   3 month(s)  The format for your next appointment:   In Person  Provider:   Jenne Campus, MD   Other Instructions

## 2021-01-07 NOTE — Progress Notes (Signed)
Cardiology Office Note:    Date:  01/07/2021   ID:  Brandon Schmitt, DOB 05-25-1961, MRN [id number]  PCP:  Patient, No Pcp Per (Inactive)  Cardiologist:  Jenne Campus, MD    Referring MD: No ref. provider found   Chief Complaint  Patient presents with  . Follow-up  Doing very well  History of Present Illness:    Brandon Schmitt is a 60 y.o. male with past medical history significant for cardiomyopathy with ejection fraction 25 to 30%, history of pancreatic carcinoma however he refused to have any work-up done on that.  He being found to be in congestive heart failure, treated appropriately improved quite dramatically.  He comes today to my office for regular follow-up.  He ran out of Brent about a week ago.  He needs refill on that medication.  Denies have any shortness of breath chest pain tightness squeezing pressure burning chest, he lost some weight in the matter-of-fact quite significant amount of weight within the last month.  And I do not think this is just congestive heart failure.  Again he does have pancreatic cancer but does not accept this diagnosis again he talks a lot about this he said that nobody told him due to his diarrhea he thinks is just a cyst and he does not want to have any procedure or surgery done for that.  In terms of congestive heart failure he is compensated today.  Past Medical History:  Diagnosis Date  . Alcohol use 02/13/2020  . Blood clotting disorder ([place]) 07/06/2016  . Cancer ([place])   . Cholelithiasis 02/13/2020  . Congestive heart failure (CHF) ([place]) 12/09/2020  . Dilated cardiomyopathy ([place]) 12/09/2020  . Elevated transaminase level 02/13/2020  . Pancreatic adenocarcinoma ([place]) 03/31/2020  . Tobacco abuse 02/13/2020  . Total bilirubin, elevated 02/13/2020    Past Surgical History:  Procedure Laterality Date  . BILIARY STENT PLACEMENT N/A 02/19/2020   Procedure: BILIARY STENT PLACEMENT;  Surgeon: Arta Silence, MD;  [facility]: [facility];  Service:  Endoscopy;  Laterality: N/A;  . ENDOSCOPIC RETROGRADE CHOLANGIOPANCREATOGRAPHY (ERCP) WITH PROPOFOL N/A 02/19/2020   Procedure: ENDOSCOPIC RETROGRADE CHOLANGIOPANCREATOGRAPHY (ERCP) WITH PROPOFOL;  Surgeon: Arta Silence, MD;  [facility];  Service: Endoscopy;  Laterality: N/A;  . ENDOSCOPIC RETROGRADE CHOLANGIOPANCREATOGRAPHY (ERCP) WITH PROPOFOL N/A 02/19/2020   Procedure: ENDOSCOPIC RETROGRADE CHOLANGIOPANCREATOGRAPHY (ERCP) WITH PROPOFOL;  Surgeon: Clarene Essex, MD;  [facility];  Service: Endoscopy;  Laterality: N/A;  EUS FIRST THEN ERCP  . ESOPHAGOGASTRODUODENOSCOPY (EGD) WITH PROPOFOL N/A 02/19/2020   Procedure: ESOPHAGOGASTRODUODENOSCOPY (EGD) WITH PROPOFOL;  Surgeon: Arta Silence, MD;  [facility];  Service: Endoscopy;  Laterality: N/A;  . EUS N/A 02/19/2020   Procedure: UPPER ENDOSCOPIC ULTRASOUND (EUS) LINEAR;  Surgeon: Arta Silence, MD;  [facility];  Service: Endoscopy;  Laterality: N/A;  EUS FIRST THEN ERCP with DR MAGOD  . FINE NEEDLE ASPIRATION N/A 02/19/2020   Procedure: FINE NEEDLE ASPIRATION (FNA) LINEAR;  Surgeon: Arta Silence, MD;  [facility];  Service: Endoscopy;  Laterality: N/A;  . SPHINCTEROTOMY  02/19/2020   Procedure: SPHINCTEROTOMY;  Surgeon: Arta Silence, MD;  [facility];  Service: Endoscopy;;    Current Medications: Current Meds  Medication Sig  . furosemide (LASIX) 40 MG tablet Take 1 tablet (40 mg total) by mouth daily.  . Multiple Vitamins-Minerals (MULTIVITAMIN WITH MINERALS) tablet Take 1 tablet by mouth daily. Unknown strength  . Pancrelipase, Lip-Prot-Amyl, (PANCREAZE) 37000-97300 units CPEP Take 1 tablet by mouth daily.  . sacubitril-valsartan (ENTRESTO) 24-26 MG  Take 1 tablet by mouth 2 (two) times daily.  . sacubitril-valsartan (ENTRESTO) 24-26 MG Take 1 tablet by mouth 2 (two) times daily.  . [DISCONTINUED] sacubitril-valsartan (ENTRESTO) 24-26 MG Take 1 tablet by mouth 2 (two)  times daily.     Allergies:   Irinotecan   Social History   Socioeconomic History  . Marital status: Single    Spouse name: Not on file  . Number of children: 5  . Years of education: Not on file  . Highest education level: Not on file  Occupational History  . Not on file  Tobacco Use  . Smoking status: Current Every Day Smoker    Packs/day: 0.00    Years: 25.00    Pack years: 0.00    Types: Cigarettes  . Smokeless tobacco: Never Used  . Tobacco comment: 2 cigs per day per pt  Substance and Sexual Activity  . Alcohol use: Yes    Comment: couple of drinks in a couple of months  . Drug use: Never  . Sexual activity: Not on file  Other Topics Concern  . Not on file  Social History Narrative  . Not on file   Social Determinants of Health   Financial Resource Strain: Not on file  Food Insecurity: Not on file  Transportation Needs: Not on file  Physical Activity: Not on file  Stress: Not on file  Social Connections: Not on file     Family History: The patient's family history is negative for Heart disease and Diabetes. ROS:   Please see the history of present illness.    All 14 point review of systems negative except as described per history of present illness  EKGs/Labs/Other Studies Reviewed:      Recent Labs: 09/29/2020: ALT 150; Hemoglobin 10.9; Platelets 237 11/30/2020: BNP 1,614.7; NT-Pro BNP 4,735 12/09/2020: BUN 12; Creatinine, Ser 0.84; Potassium 4.5; Sodium 139  Recent Lipid Panel No results found for: CHOL, TRIG, HDL, CHOLHDL, VLDL, LDLCALC, LDLDIRECT  Physical Exam:    VS:  BP 100/60 (BP Location: Right Arm, Patient Position: Sitting)   Pulse 76   Ht 6\' 2"  (1.88 m)   Wt 169 lb (76.7 kg)   SpO2 97%   BMI 21.70 kg/m     Wt Readings from Last 3 Encounters:  01/06/21 169 lb (76.7 kg)  12/09/20 182 lb (82.6 kg)  11/30/20 205 lb (93 kg)     GEN:  Well nourished, well developed in no acute distress HEENT: Normal NECK: No JVD; No carotid  bruits LYMPHATICS: No lymphadenopathy CARDIAC: RRR, no murmurs, no rubs, no gallops RESPIRATORY:  Clear to auscultation without rales, wheezing or rhonchi  ABDOMEN: Soft, non-tender, non-distended MUSCULOSKELETAL:  No edema; No deformity  SKIN: Warm and dry LOWER EXTREMITIES: no swelling NEUROLOGIC:  Alert and oriented x 3 PSYCHIATRIC:  Normal affect   ASSESSMENT:    1. Dilated cardiomyopathy ([place])   2. Pancreatic adenocarcinoma ([place])   3. Chronic combined systolic and diastolic congestive heart failure (HCC)    PLAN:    In order of problems listed above:  1. Dilated cardiomyopathy I will refill his Entresto.  I did review his Chem-7 done last time when he was in the office everything looks good.  He is compensated.  His blood pressure is in the lower side therefore there is no room for additional medications. 2. Pancreatic carcinoma I do not think he is aware of seriousness of his problem.  What may be really very concern is his weight loss.  I do not think this is just congestive heart failure.  He described the fact that his appetite is poor.  However he also tells me that he is planting trees because he would like to have peaches within the next few years. 3. Congestive heart failure compensated.  Overall is a very sad story I do think he got understanding of seriousness of his situation.  He does not want to do investigation for his mass in the pancreas.   Medication Adjustments/Labs and Tests Ordered: Current medicines are reviewed at length with the patient today.  Concerns regarding medicines are outlined above.  No orders of the defined types were placed in this encounter.  Medication changes:  Meds ordered this encounter  Medications  . DISCONTD: sacubitril-valsartan (ENTRESTO) 24-26 MG    Sig: Take 1 tablet by mouth 2 (two) times daily.    Dispense:  60 tablet    Refill:  1  . sacubitril-valsartan (ENTRESTO) 24-26 MG    Sig: Take 1 tablet by mouth 2 (two) times  daily.    Dispense:  60 tablet    Refill:  1    Signed, Park Liter, MD, [facility] 01/07/2021 7:58 AM    [place]

## 2021-04-22 ENCOUNTER — Ambulatory Visit: Payer: Managed Care, Other (non HMO) | Admitting: Cardiology

## 2021-04-22 ENCOUNTER — Other Ambulatory Visit: Payer: Self-pay

## 2021-04-22 ENCOUNTER — Encounter: Payer: Self-pay | Admitting: Cardiology

## 2021-04-22 VITALS — BP 124/72 | HR 51 | Ht 74.0 in | Wt 165.0 lb

## 2021-04-22 DIAGNOSIS — Z7689 Persons encountering health services in other specified circumstances: Secondary | ICD-10-CM

## 2021-04-22 DIAGNOSIS — C259 Malignant neoplasm of pancreas, unspecified: Secondary | ICD-10-CM | POA: Diagnosis not present

## 2021-04-22 DIAGNOSIS — I5042 Chronic combined systolic (congestive) and diastolic (congestive) heart failure: Secondary | ICD-10-CM

## 2021-04-22 DIAGNOSIS — I420 Dilated cardiomyopathy: Secondary | ICD-10-CM

## 2021-04-22 NOTE — Progress Notes (Signed)
Cardiology Office Note:    Date:  04/22/2021   ID:  Brandon Schmitt, DOB Jun 11, 1961, MRN [id number]  PCP:  Patient, No Pcp Per (Inactive)  Cardiologist:  Jenne Campus, MD    Referring MD: No ref. provider found   Chief Complaint  Patient presents with   Fatigue    History of Present Illness:    Brandon Schmitt is a 60 y.o. male with past medical history significant for cardiomyopathy with ejection fraction 25 to 30%.  He also does have history of pancreatic carcinoma but reduce to accept this diagnosis he thinks all doctors rhonchi he does not really have pancreatic problem.  Is coming today to my office for follow-up.  Overall he seems to be doing well.  He denies have any chest pain tightness squeezing pressure burning chest.  He complained of having fatigue and tiredness.  No pain.  No swelling of lower extremities no dizziness no passing out  Past Medical History:  Diagnosis Date   Alcohol use 02/13/2020   Blood clotting disorder ([place]) 07/06/2016   Cancer ([place])    Cholelithiasis 02/13/2020   Congestive heart failure (CHF) ([place]) 12/09/2020   Dilated cardiomyopathy ([place]) 12/09/2020   Elevated transaminase level 02/13/2020   Pancreatic adenocarcinoma ([place]) 03/31/2020   Tobacco abuse 02/13/2020   Total bilirubin, elevated 02/13/2020    Past Surgical History:  Procedure Laterality Date   BILIARY STENT PLACEMENT N/A 02/19/2020   Procedure: BILIARY STENT PLACEMENT;  Surgeon: Arta Silence, MD;  [facility];  Service: Endoscopy;  Laterality: N/A;   ENDOSCOPIC RETROGRADE CHOLANGIOPANCREATOGRAPHY (ERCP) WITH PROPOFOL N/A 02/19/2020   Procedure: ENDOSCOPIC RETROGRADE CHOLANGIOPANCREATOGRAPHY (ERCP) WITH PROPOFOL;  Surgeon: Arta Silence, MD;  [facility];  Service: Endoscopy;  Laterality: N/A;   ENDOSCOPIC RETROGRADE CHOLANGIOPANCREATOGRAPHY (ERCP) WITH PROPOFOL N/A 02/19/2020   Procedure: ENDOSCOPIC RETROGRADE CHOLANGIOPANCREATOGRAPHY (ERCP) WITH PROPOFOL;  Surgeon:  Clarene Essex, MD;  [facility];  Service: Endoscopy;  Laterality: N/A;  EUS FIRST THEN ERCP   ESOPHAGOGASTRODUODENOSCOPY (EGD) WITH PROPOFOL N/A 02/19/2020   Procedure: ESOPHAGOGASTRODUODENOSCOPY (EGD) WITH PROPOFOL;  Surgeon: Arta Silence, MD;  [facility];  Service: Endoscopy;  Laterality: N/A;   EUS N/A 02/19/2020   Procedure: UPPER ENDOSCOPIC ULTRASOUND (EUS) LINEAR;  Surgeon: Arta Silence, MD;  [facility];  Service: Endoscopy;  Laterality: N/A;  EUS FIRST THEN ERCP with DR MAGOD   FINE NEEDLE ASPIRATION N/A 02/19/2020   Procedure: FINE NEEDLE ASPIRATION (FNA) LINEAR;  Surgeon: Arta Silence, MD;  [facility];  Service: Endoscopy;  Laterality: N/A;   SPHINCTEROTOMY  02/19/2020   Procedure: SPHINCTEROTOMY;  Surgeon: Arta Silence, MD;  [facility];  Service: Endoscopy;;    Current Medications: Current Meds  Medication Sig   furosemide (LASIX) 40 MG tablet Take 1 tablet (40 mg total) by mouth daily.   Multiple Vitamins-Minerals (MULTIVITAMIN WITH MINERALS) tablet Take 1 tablet by mouth daily. Unknown strength   Pancrelipase, Lip-Prot-Amyl, (PANCREAZE) 37000-97300 units CPEP Take 1 tablet by mouth daily.   sacubitril-valsartan (ENTRESTO) 24-26 MG Take 1 tablet by mouth 2 (two) times daily.     Allergies:   Irinotecan   Social History   Socioeconomic History   Marital status: Single    Spouse name: Not on file   Number of children: 5   Years of education: Not on file   Highest education level: Not on file  Occupational History   Not on file  Tobacco Use   Smoking status: Every Day    Packs/day: 0.00  Years: 25.00    Pack years: 0.00    Types: Cigarettes   Smokeless tobacco: Never   Tobacco comments:    2 cigs per day per pt  Substance and Sexual Activity   Alcohol use: Yes    Comment: couple of drinks in a couple of months   Drug use: Never   Sexual activity: Not on file  Other Topics Concern   Not on file   Social History Narrative   Not on file   Social Determinants of Health   Financial Resource Strain: Not on file  Food Insecurity: Not on file  Transportation Needs: Not on file  Physical Activity: Not on file  Stress: Not on file  Social Connections: Not on file     Family History: The patient's family history is negative for Heart disease and Diabetes. ROS:   Please see the history of present illness.    All 14 point review of systems negative except as described per history of present illness  EKGs/Labs/Other Studies Reviewed:      Recent Labs: 09/29/2020: ALT 150; Hemoglobin 10.9; Platelets 237 11/30/2020: BNP 1,614.7; NT-Pro BNP 4,735 12/09/2020: BUN 12; Creatinine, Ser 0.84; Potassium 4.5; Sodium 139  Recent Lipid Panel No results found for: CHOL, TRIG, HDL, CHOLHDL, VLDL, LDLCALC, LDLDIRECT  Physical Exam:    VS:  BP 124/72 (BP Location: Right Arm, Patient Position: Sitting)   Pulse (!) 51   Ht 6\' 2"  (1.88 m)   Wt 165 lb (74.8 kg)   SpO2 95%   BMI 21.18 kg/m     Wt Readings from Last 3 Encounters:  04/22/21 165 lb (74.8 kg)  01/06/21 169 lb (76.7 kg)  12/09/20 182 lb (82.6 kg)     GEN:  Well nourished, well developed in no acute distress HEENT: Normal NECK: No JVD; No carotid bruits LYMPHATICS: No lymphadenopathy CARDIAC: RRR, no murmurs, no rubs, no gallops RESPIRATORY:  Clear to auscultation without rales, wheezing or rhonchi  ABDOMEN: Soft, non-tender, non-distended MUSCULOSKELETAL:  No edema; No deformity  SKIN: Warm and dry LOWER EXTREMITIES: no swelling NEUROLOGIC:  Alert and oriented x 3 PSYCHIATRIC:  Normal affect   ASSESSMENT:    1. Dilated cardiomyopathy ([place])   2. Chronic combined systolic and diastolic congestive heart failure ([place])   3. Pancreatic adenocarcinoma (HCC)    PLAN:    In order of problems listed above:  Dilated cardiomyopathy with latest estimation of ejection fraction done at the beginning of this year showing 25 to  30%.  He is only on Entresto 24/26 twice daily.  I will check Chem-7 and see if Chem-7 is acceptable if that is the case we will try to doubling the dose of this medication.  Also initiated conversation about potentially starting him on beta-blocker however previously he felt miserable with carvedilol therefore this medication has been discontinued.  Overall it is a difficult situation but luckily hemodynamically he seems to be compensated and he is euvolemic on my physical examination. Chronic systolic congestive heart failure compensated New York Heart Association class II Pancreatic carcinoma he still does not accept his diagnosis.  He is losing some weight he described to have some queasiness in his epigastrium but overall he seems to be doing quite well from that point review.  He requested to be scheduled to see primary care physician so we will refer him to family practice group here in the med center.   Medication Adjustments/Labs and Tests Ordered: Current medicines are reviewed at length with the  patient today.  Concerns regarding medicines are outlined above.  No orders of the defined types were placed in this encounter.  Medication changes: No orders of the defined types were placed in this encounter.   Signed, Park Liter, MD, [facility] 04/22/2021 3:55 PM    [place]

## 2021-04-22 NOTE — Patient Instructions (Addendum)
Medication Instructions:  Your physician recommends that you continue on your current medications as directed. Please refer to the Current Medication list given to you today.  *If you need a refill on your cardiac medications before your next appointment, please call your pharmacy*   Lab Work: Your physician recommends that you return for lab work in:  TODAY: BMP  If you have labs (blood work) drawn today and your tests are completely normal, you will receive your results only by: [place] (if you have [place]) OR A paper copy in the mail If you have any lab test that is abnormal or we need to change your treatment, we will call you to review the results.   Testing/Procedures:    Follow-Up: At [facility], you and your health needs are our priority.  As part of our continuing mission to provide you with exceptional heart care, we have created designated Provider Care Teams.  These Care Teams include your primary Cardiologist (physician) and Advanced Practice Providers (APPs -  Physician Assistants and Nurse Practitioners) who all work together to provide you with the care you need, when you need it.  We recommend signing up for the patient portal called "MyChart".  Sign up information is provided on this After Visit Summary.  MyChart is used to connect with patients for Virtual Visits (Telemedicine).  Patients are able to view lab/test results, encounter notes, upcoming appointments, etc.  Non-urgent messages can be sent to your provider as well.   To learn more about what you can do with MyChart, go to [email or web address].    Your next appointment:   3 month(s)  The format for your next appointment:   In Person  Provider:   Jenne Campus, MD   Other Instructions Referral sent to [facility]

## 2021-04-23 ENCOUNTER — Telehealth: Payer: Self-pay

## 2021-04-23 LAB — BASIC METABOLIC PANEL
BUN/Creatinine Ratio: 19 (ref 9–20)
BUN: 14 mg/dL (ref 6–24)
CO2: 24 mmol/L (ref 20–29)
Calcium: 9.5 mg/dL (ref 8.7–10.2)
Chloride: 104 mmol/L (ref 96–106)
Creatinine, Ser: 0.74 mg/dL — ABNORMAL LOW (ref 0.76–1.27)
Glucose: 91 mg/dL (ref 65–99)
Potassium: 4.4 mmol/L (ref 3.5–5.2)
Sodium: 143 mmol/L (ref 134–144)
eGFR: 104 mL/min/{1.73_m2} (ref >59)

## 2021-04-23 NOTE — Telephone Encounter (Signed)
Spoke with patient regarding results and recommendation.  Patient verbalizes understanding and is agreeable to plan of care. Advised patient to call back with any issues or concerns.  

## 2021-04-23 NOTE — Telephone Encounter (Signed)
-----   Message from Richardo Priest, MD sent at 04/23/2021 12:39 PM EDT ----- Normal or stable result

## 2021-04-26 ENCOUNTER — Telehealth: Payer: Self-pay

## 2021-04-26 NOTE — Telephone Encounter (Signed)
Left message on patients voicemail to please return our call.   

## 2021-04-26 NOTE — Telephone Encounter (Signed)
-----   Message from Park Liter, MD sent at 04/26/2021  9:16 AM EDT ----- Chem-7 looks normal, normal kidney function normal potassium please double Entresto to 4852 twice daily, he need to have Chem-7 repeated in about 1 week

## 2021-04-27 ENCOUNTER — Telehealth: Payer: Self-pay

## 2021-04-27 NOTE — Telephone Encounter (Signed)
-----   Message from Park Liter, MD sent at 04/26/2021  9:16 AM EDT ----- Chem-7 looks normal, normal kidney function normal potassium please double Entresto to 4852 twice daily, he need to have Chem-7 repeated in about 1 week

## 2021-04-27 NOTE — Telephone Encounter (Signed)
Left message on patients voicemail to please return our call.   

## 2021-04-28 ENCOUNTER — Telehealth: Payer: Self-pay

## 2021-04-28 DIAGNOSIS — I420 Dilated cardiomyopathy: Secondary | ICD-10-CM

## 2021-04-28 MED ORDER — SACUBITRIL-VALSARTAN 49-51 MG PO TABS: 1.0000 | ORAL_TABLET | Freq: Two times a day (BID) | ORAL | 3 refills | Status: DC

## 2021-04-28 NOTE — Telephone Encounter (Signed)
Spoke with patient regarding results and recommendation.  Patient verbalizes understanding and is agreeable to plan of care. Advised patient to call back with any issues or concerns.  

## 2021-04-28 NOTE — Telephone Encounter (Signed)
-----   Message from Park Liter, MD sent at 04/26/2021  9:16 AM EDT ----- Chem-7 looks normal, normal kidney function normal potassium please double Entresto to 4852 twice daily, he need to have Chem-7 repeated in about 1 week

## 2021-07-07 ENCOUNTER — Encounter: Payer: Self-pay | Admitting: Emergency Medicine

## 2021-07-07 ENCOUNTER — Telehealth: Payer: Self-pay | Admitting: Cardiology

## 2021-07-07 MED ORDER — ENTRESTO 24-26 MG PO TABS: 1.0000 | ORAL_TABLET | Freq: Two times a day (BID) | ORAL | 2 refills | Status: DC

## 2021-07-07 NOTE — Telephone Encounter (Signed)
Called patient. Informed him we can try a lower dose on entresto per Dr. Agustin Cree and see if that helps. He agreed. Patient will stop entresto 49/51 and start entresto 24/26 twice daily.   Work note made for patient, placed at [place] front desk. He will pick up tomorrow.

## 2021-07-07 NOTE — Telephone Encounter (Signed)
   Pt c/o medication issue:  1. Name of Medication:   sacubitril-valsartan (ENTRESTO) 49-51 MG   2. How are you currently taking this medication (dosage and times per day)? Take 1 tablet by mouth 2 (two) times daily.  3. Are you having a reaction (difficulty breathing--STAT)?   4. What is your medication issue? Pt said he is feeling tired when taking this meds, he said he doesn't think it is helping him. He also said he needs a work note stating he can't work as of this time.

## 2021-07-12 ENCOUNTER — Emergency Department (HOSPITAL_BASED_OUTPATIENT_CLINIC_OR_DEPARTMENT_OTHER): Payer: Managed Care, Other (non HMO)

## 2021-07-12 ENCOUNTER — Observation Stay (HOSPITAL_BASED_OUTPATIENT_CLINIC_OR_DEPARTMENT_OTHER)
Admission: EM | Admit: 2021-07-12 | Discharge: 2021-07-13 | Disposition: A | Discharge status: Home / Self Care | Payer: Managed Care, Other (non HMO) | Attending: Internal Medicine | Admitting: Internal Medicine

## 2021-07-12 ENCOUNTER — Other Ambulatory Visit: Payer: Self-pay

## 2021-07-12 ENCOUNTER — Encounter (HOSPITAL_BASED_OUTPATIENT_CLINIC_OR_DEPARTMENT_OTHER): Payer: Self-pay

## 2021-07-12 ENCOUNTER — Telehealth: Payer: Self-pay | Admitting: Cardiology

## 2021-07-12 DIAGNOSIS — R778 Other specified abnormalities of plasma proteins: Secondary | ICD-10-CM | POA: Insufficient documentation

## 2021-07-12 DIAGNOSIS — I110 Hypertensive heart disease with heart failure: Secondary | ICD-10-CM | POA: Diagnosis not present

## 2021-07-12 DIAGNOSIS — F17210 Nicotine dependence, cigarettes, uncomplicated: Secondary | ICD-10-CM | POA: Diagnosis not present

## 2021-07-12 DIAGNOSIS — R0602 Shortness of breath: Secondary | ICD-10-CM | POA: Diagnosis present

## 2021-07-12 DIAGNOSIS — I5023 Acute on chronic systolic (congestive) heart failure: Secondary | ICD-10-CM | POA: Diagnosis not present

## 2021-07-12 DIAGNOSIS — Z8507 Personal history of malignant neoplasm of pancreas: Secondary | ICD-10-CM | POA: Diagnosis not present

## 2021-07-12 DIAGNOSIS — I5043 Acute on chronic combined systolic (congestive) and diastolic (congestive) heart failure: Principal | ICD-10-CM | POA: Insufficient documentation

## 2021-07-12 DIAGNOSIS — I509 Heart failure, unspecified: Secondary | ICD-10-CM

## 2021-07-12 DIAGNOSIS — C259 Malignant neoplasm of pancreas, unspecified: Secondary | ICD-10-CM | POA: Diagnosis present

## 2021-07-12 DIAGNOSIS — Z720 Tobacco use: Secondary | ICD-10-CM

## 2021-07-12 LAB — CBC WITH DIFFERENTIAL/PLATELET
Abs Immature Granulocytes: 0.02 10*3/uL (ref 0.00–0.07)
Basophils Absolute: 0 10*3/uL (ref 0.0–0.1)
Basophils Relative: 0 %
Eosinophils Absolute: 0 10*3/uL (ref 0.0–0.5)
Eosinophils Relative: 0 %
HCT: 38.8 % — ABNORMAL LOW (ref 39.0–52.0)
Hemoglobin: 13.3 g/dL (ref 13.0–17.0)
Immature Granulocytes: 0 %
Lymphocytes Relative: 18 %
Lymphs Abs: 1.3 10*3/uL (ref 0.7–4.0)
MCH: 30.3 pg (ref 26.0–34.0)
MCHC: 34.3 g/dL (ref 30.0–36.0)
MCV: 88.4 fL (ref 80.0–100.0)
Monocytes Absolute: 0.4 10*3/uL (ref 0.1–1.0)
Monocytes Relative: 6 %
Neutro Abs: 5.4 10*3/uL (ref 1.7–7.7)
Neutrophils Relative %: 76 %
Platelets: 248 10*3/uL (ref 150–400)
RBC: 4.39 MIL/uL (ref 4.22–5.81)
RDW: 15.6 % — ABNORMAL HIGH (ref 11.5–15.5)
WBC: 7.1 10*3/uL (ref 4.0–10.5)
nRBC: 0 % (ref 0.0–0.2)

## 2021-07-12 LAB — COMPREHENSIVE METABOLIC PANEL
ALT: 40 U/L (ref 0–44)
AST: 40 U/L (ref 15–41)
Albumin: 3.7 g/dL (ref 3.5–5.0)
Alkaline Phosphatase: 101 U/L (ref 38–126)
Anion gap: 10 (ref 5–15)
BUN: 12 mg/dL (ref 6–20)
CO2: 24 mmol/L (ref 22–32)
Calcium: 8.9 mg/dL (ref 8.9–10.3)
Chloride: 105 mmol/L (ref 98–111)
Creatinine, Ser: 0.87 mg/dL (ref 0.61–1.24)
GFR, Estimated: >60 mL/min (ref >60)
Glucose, Bld: 139 mg/dL — ABNORMAL HIGH (ref 70–99)
Potassium: 3.7 mmol/L (ref 3.5–5.1)
Sodium: 139 mmol/L (ref 135–145)
Total Bilirubin: 1 mg/dL (ref 0.3–1.2)
Total Protein: 7.3 g/dL (ref 6.5–8.1)

## 2021-07-12 LAB — TROPONIN I (HIGH SENSITIVITY)
Troponin I (High Sensitivity): 36 ng/L — ABNORMAL HIGH (ref <18)
Troponin I (High Sensitivity): 38 ng/L — ABNORMAL HIGH (ref <18)

## 2021-07-12 LAB — MAGNESIUM: Magnesium: 2.2 mg/dL (ref 1.7–2.4)

## 2021-07-12 MED ORDER — POTASSIUM CHLORIDE CRYS ER 20 MEQ PO TBCR
40.0000 meq | EXTENDED_RELEASE_TABLET | Freq: Once | ORAL | Status: AC
  Administered 2021-07-13: 40 meq via ORAL
  Filled 2021-07-12: qty 2

## 2021-07-12 MED ORDER — SACUBITRIL-VALSARTAN 49-51 MG PO TABS
1.0000 | ORAL_TABLET | Freq: Two times a day (BID) | ORAL | Status: DC
  Administered 2021-07-13: 1 via ORAL
  Filled 2021-07-12: qty 1

## 2021-07-12 MED ORDER — PANCRELIPASE (LIP-PROT-AMYL) 36000-114000 UNITS PO CPEP
36000.0000 [IU] | ORAL_CAPSULE | Freq: Every day | ORAL | Status: DC
  Filled 2021-07-12 (×2): qty 1

## 2021-07-12 MED ORDER — FUROSEMIDE 10 MG/ML IJ SOLN
40.0000 mg | Freq: Once | INTRAMUSCULAR | Status: AC
  Administered 2021-07-13: 40 mg via INTRAVENOUS
  Filled 2021-07-12: qty 4

## 2021-07-12 MED ORDER — ACETAMINOPHEN 650 MG RE SUPP: 650.0000 mg | Freq: Four times a day (QID) | RECTAL | Status: DC | PRN

## 2021-07-12 MED ORDER — SACUBITRIL-VALSARTAN 24-26 MG PO TABS: 1.0000 | ORAL_TABLET | Freq: Two times a day (BID) | ORAL | Status: DC

## 2021-07-12 MED ORDER — FUROSEMIDE 10 MG/ML IJ SOLN
40.0000 mg | Freq: Once | INTRAMUSCULAR | Status: AC
  Administered 2021-07-12: 40 mg via INTRAVENOUS
  Filled 2021-07-12: qty 4

## 2021-07-12 MED ORDER — ACETAMINOPHEN 325 MG PO TABS: 650.0000 mg | ORAL_TABLET | Freq: Four times a day (QID) | ORAL | Status: DC | PRN

## 2021-07-12 MED ORDER — IOHEXOL 350 MG/ML SOLN
100.0000 mL | Freq: Once | INTRAVENOUS | Status: AC | PRN
  Administered 2021-07-12: 100 mL via INTRAVENOUS

## 2021-07-12 MED ORDER — FUROSEMIDE 10 MG/ML IJ SOLN: 40.0000 mg | Freq: Two times a day (BID) | INTRAMUSCULAR | Status: DC

## 2021-07-12 MED ORDER — SACUBITRIL-VALSARTAN 24-26 MG PO TABS
1.0000 | ORAL_TABLET | Freq: Once | ORAL | Status: AC
  Administered 2021-07-13: 1 via ORAL
  Filled 2021-07-12: qty 1

## 2021-07-12 NOTE — ED Notes (Signed)
Unsuccessful IV attempt x2.  

## 2021-07-12 NOTE — H&P (Signed)
History and Physical    PLEASE NOTE THAT DRAGON DICTATION SOFTWARE WAS USED IN THE CONSTRUCTION OF THIS NOTE.   Brandon Schmitt EHU:[id number] DOB: 1961-07-21 DOA: 07/12/2021  PCP: Patient, No Pcp Per (Inactive) Patient coming from: home   I have personally briefly reviewed patient's old medical records in [place]  Chief Complaint: Shortness of breath  HPI: Brandon Schmitt is a 60 y.o. male with medical history significant for metastatic pancreatic cancer, chronic combined systolic/diastolic heart failure, chronic tobacco abuse who is admitted to [facility] on 07/12/2021 as transfer for [place] emergency department with acute on chronic systolic/diastolic heart failure after presenting from home to the latter facility complaining of shortness of breath.   The patient reports 3 to 4 days of progressive shortness of breath associated with orthopnea, and worsening edema in the bilateral lower extremities over that time.  Denies any overt PND, and he is unsure as to any specific quantitative weight fluctuation over the last few days.  Denies any associated chest pain, diaphoresis, palpitations, nausea, vomiting, presyncope, or syncope.  He notes associated nonproductive cough, which is new for him, in the absence of any hemoptysis, new lower extremity erythema, or calf tenderness.  Patient knowledges a history of pancreatic cancer, will denies any recent trauma, travel, surgical procedures, periods of prolonged diminished amatory status.  No recent melena or hematochezia.  Not associate with any subjective fever, chills, rigors, or generalized myalgias.  No recent rhinitis, rhinorrhea, sore throat.  Denies any recent worsening of abdominal pain, nor any recent diarrhea or rash.  Denies any recent COVID-19 exposures.  Has a history of chronic systolic/diastolic heart failure, with most recent echocardiogram in February 2022 showing LVEF 25 to 30%, severely dilated left  ventricular internal cavity, grade 3 diastolic dysfunction, severely dilated bilateral atria, moderate mitral regurgitation, mild to moderate tricuspid regurgitation.  He reports good compliance with his home diuretic regimen which consists of Lasix 40 mg p.o. daily.  He is also on Entresto, while acknowledging a recent dose reduction this medication.  Does not appear to be on a beta-blocker as an outpatient.     [facility] ED Course:  Vital signs in the ED were notable for the following: Tetramex 97.6, heart rate 87 O2 sat 1 blood pressure 124/90 -134/99, respiratory rate 18-25, oxygen saturation on room air noted to be 92 to 100%, however, in the setting of the patient's shortness of breath, he was empirically started on 2 L nasal cannula, and maintain oxygen saturations of 100% on this rate of supplemental O2.  Labs were notable for the following: CMP was notable for the following: Sodium 139, potassium 3.7, bicarbonate 24, creatinine 0.87.  Liver enzymes found to be within normal limits.  High-sensitivity troponin I initially noted to be 36 with repeat value trending up slightly to 38.  CBC notable for white cell count 7100, hemoglobin 13.3.  Nasopharyngeal COVID-19/influenza PCR were checked, with results currently pending.  Imaging and additional notable ED work-up: EKG, in comparison to most recent prior from 04/22/2021 shows sinus rhythm with heart rate 85, T wave inversion in lateral leads, unchanged most recent prior EKG, as well as less than 1 mm ST elevation in V2/V3, also unchanged from most recent prior EKG.  Chest x-ray showed evidence of pulmonary vascular congestion, mild pulmonary edema, and small bilateral pleural effusions.  In the setting of the patient's known underlying malignancy, CTA chest was pursued, and showed no evidence of acute pulmonary embolism, will demonstrating  moderate bilateral pleural effusions as well as evidence of pulmonary edema in the absence of any evidence of  pneumothorax.  EDP discussed the patient's case with the on-call cardiologist, who recommended transfer to the hospitalist service at [facility] for further evaluation management of acute on chronic systolic/diastolic heart failure including need for additional IV diuresis, with plan for cardiology to formally consult.   While in the ED, the following were administered: Lasix 40 mg IV x1 at 1400.     Review of Systems: As per HPI otherwise 10 point review of systems negative.   Past Medical History:  Diagnosis Date   Alcohol use 02/13/2020   Blood clotting disorder ([place]) 07/06/2016   Cancer ([place])    Cholelithiasis 02/13/2020   Congestive heart failure (CHF) ([place]) 12/09/2020   Dilated cardiomyopathy ([place]) 12/09/2020   Elevated transaminase level 02/13/2020   Pancreatic adenocarcinoma ([place]) 03/31/2020   Tobacco abuse 02/13/2020   Total bilirubin, elevated 02/13/2020    Past Surgical History:  Procedure Laterality Date   BILIARY STENT PLACEMENT N/A 02/19/2020   Procedure: BILIARY STENT PLACEMENT;  Surgeon: Arta Silence, MD;  Location: [facility];  Service: Endoscopy;  Laterality: N/A;   ENDOSCOPIC RETROGRADE CHOLANGIOPANCREATOGRAPHY (ERCP) WITH PROPOFOL N/A 02/19/2020   Procedure: ENDOSCOPIC RETROGRADE CHOLANGIOPANCREATOGRAPHY (ERCP) WITH PROPOFOL;  Surgeon: Arta Silence, MD;  Location: [facility];  Service: Endoscopy;  Laterality: N/A;   ENDOSCOPIC RETROGRADE CHOLANGIOPANCREATOGRAPHY (ERCP) WITH PROPOFOL N/A 02/19/2020   Procedure: ENDOSCOPIC RETROGRADE CHOLANGIOPANCREATOGRAPHY (ERCP) WITH PROPOFOL;  Surgeon: Clarene Essex, MD;  Location: [facility];  Service: Endoscopy;  Laterality: N/A;  EUS FIRST THEN ERCP   ESOPHAGOGASTRODUODENOSCOPY (EGD) WITH PROPOFOL N/A 02/19/2020   Procedure: ESOPHAGOGASTRODUODENOSCOPY (EGD) WITH PROPOFOL;  Surgeon: Arta Silence, MD;  Location: [facility];  Service: Endoscopy;  Laterality: N/A;   EUS N/A 02/19/2020   Procedure: UPPER ENDOSCOPIC ULTRASOUND  (EUS) LINEAR;  Surgeon: Arta Silence, MD;  Location: [facility];  Service: Endoscopy;  Laterality: N/A;  EUS FIRST THEN ERCP with DR MAGOD   FINE NEEDLE ASPIRATION N/A 02/19/2020   Procedure: FINE NEEDLE ASPIRATION (FNA) LINEAR;  Surgeon: Arta Silence, MD;  Location: [facility];  Service: Endoscopy;  Laterality: N/A;   SPHINCTEROTOMY  02/19/2020   Procedure: SPHINCTEROTOMY;  Surgeon: Arta Silence, MD;  Location: [facility];  Service: Endoscopy;;    Social History:  reports that he has been smoking cigarettes and cigars. He has never used smokeless tobacco. He reports current alcohol use. He reports current drug use. Drug: Marijuana.   Allergies  Allergen Reactions   Irinotecan Other (See Comments) and Shortness Of Breath    Tachycardia, hypertension, respiratory distress with productive cough; (onset of symptoms was 30 minutes after oxaliplatin infusion and 30 minutes into the irinotecan infusion) cycle 7 of 8,    Family History  Problem Relation Age of Onset   Heart disease Neg Hx    Diabetes Neg Hx     Family history reviewed and not pertinent    Prior to Admission medications   Medication Sig Start Date End Date Taking? Authorizing Provider  furosemide (LASIX) 40 MG tablet Take 1 tablet (40 mg total) by mouth daily. 11/30/20 04/22/21  Richardo Priest, MD  Multiple Vitamins-Minerals (MULTIVITAMIN WITH MINERALS) tablet Take 1 tablet by mouth daily. Unknown strength    [provider]  Pancrelipase, Lip-Prot-Amyl, (PANCREAZE) 37000-97300 units CPEP Take 1 tablet by mouth daily. 11/27/20   [provider]  sacubitril-valsartan (ENTRESTO) 24-26 MG Take 1 tablet by mouth 2 (two) times daily. 07/07/21  Park Liter, MD     Objective    Physical Exam: Vitals:   07/12/21 1321 07/12/21 1330 07/12/21 1424 07/12/21 1815  BP: (!) 139/97 (!) 134/97 124/90 (!) 135/107  Pulse: 86 87 95 99  Resp: (!) 24 18 (!) 22 20  Temp:      TempSrc:      SpO2:  92% 100% 100% 100%    General: appears to be stated age; alert, oriented; mildly increased work of breathing noted Skin: warm, dry, no rash Head:  AT/[place] Mouth:  Oral mucosa membranes appear moist, normal dentition Neck: supple; trachea midline Heart:  RRR; did not appreciate any M/R/G Lungs: Bibasilar crackles noted but otherwise CTAB, did not appreciate any wheezes, or rhonchi Abdomen: + BS; soft, ND, NT Vascular: 2+ pedal pulses b/l; 2+ radial pulses b/l Extremities: 1-2+ edema in the bilateral lower extremities, no muscle wasting Neuro: strength and sensation intact in upper and lower extremities b/l    Labs on Admission: I have personally reviewed following labs and imaging studies  CBC: Recent Labs  Lab 07/12/21 1229  WBC 7.1  NEUTROABS 5.4  HGB 13.3  HCT 38.8*  MCV 88.4  PLT 979   Basic Metabolic Panel: Recent Labs  Lab 07/12/21 1229  NA 139  K 3.7  CL 105  CO2 24  GLUCOSE 139*  BUN 12  CREATININE 0.87  CALCIUM 8.9   GFR: CrCl cannot be calculated (Unknown ideal weight.). Liver Function Tests: Recent Labs  Lab 07/12/21 1229  AST 40  ALT 40  ALKPHOS 101  BILITOT 1.0  PROT 7.3  ALBUMIN 3.7   No results for input(s): LIPASE, AMYLASE in the last 168 hours. No results for input(s): AMMONIA in the last 168 hours. Coagulation Profile: No results for input(s): INR, PROTIME in the last 168 hours. Cardiac Enzymes: No results for input(s): CKTOTAL, CKMB, CKMBINDEX, TROPONINI in the last 168 hours. BNP (last 3 results) Recent Labs    11/30/20 1706  PROBNP 4,735*   HbA1C: No results for input(s): HGBA1C in the last 72 hours. CBG: No results for input(s): GLUCAP in the last 168 hours. Lipid Profile: No results for input(s): CHOL, HDL, LDLCALC, TRIG, CHOLHDL, LDLDIRECT in the last 72 hours. Thyroid Function Tests: No results for input(s): TSH, T4TOTAL, FREET4, T3FREE, THYROIDAB in the last 72 hours. Anemia Panel: No results for input(s): VITAMINB12,  FOLATE, FERRITIN, TIBC, IRON, RETICCTPCT in the last 72 hours. Urine analysis: No results found for: COLORURINE, APPEARANCEUR, [place], [place], [place], [place], [place], [place], [place], [place], [place], LEUKOCYTESUR  Radiological Exams on Admission: DG Chest 2 View  Result Date: 07/12/2021 CLINICAL DATA:  cocnern for pulm edema EXAM: CHEST - 2 VIEW COMPARISON:  09/29/2020. FINDINGS: Prominence of the hila bilaterally. Mild interstitial prominence. Small bilateral pleural effusions. Mild enlargement the cardiac silhouette, similar to prior. Right IJ central venous catheter with the tip projecting at the right atrium. No visible pneumothorax. No acute osseous abnormality. IMPRESSION: Prominence of the hila bilaterally with mild interstitial prominence and small bilateral pleural effusions, similar to prior and possibly representing pulmonary vascular congestion and mild pulmonary edema. Electronically Signed   By: Margaretha Sheffield M.D.   On: 07/12/2021 13:38   CT Angio Chest PE W and/or Wo Contrast  Result Date: 07/12/2021 CLINICAL DATA:  Rule out PE, shortness of breath, lower extremity swelling, history of congestive heart failure and pancreatic cancer EXAM: CT ANGIOGRAPHY CHEST WITH CONTRAST TECHNIQUE: Multidetector CT imaging of the chest was performed using the standard protocol  during bolus administration of intravenous contrast. Multiplanar CT image reconstructions and MIPs were obtained to evaluate the vascular anatomy. CONTRAST:  138mL OMNIPAQUE IOHEXOL 350 MG/ML SOLN COMPARISON:  09/29/2020 FINDINGS: Cardiovascular: Satisfactory opacification of the pulmonary arteries to the segmental level. No evidence of pulmonary embolism. Cardiomegaly. Three-vessel coronary artery calcifications. No pericardial effusion. Aortic atherosclerosis. Mediastinum/Nodes: No enlarged mediastinal, hilar, or axillary lymph nodes. Thyroid gland, trachea, and esophagus demonstrate no significant  findings. Lungs/Pleura: Moderate bilateral pleural effusions and associated atelectasis or consolidation. Diffuse bilateral bronchial wall thickening. Upper Abdomen: No acute abnormality. Common bile duct stent partially imaged in the included upper abdomen. Musculoskeletal: No chest wall abnormality. No acute or significant osseous findings. Review of the MIP images confirms the above findings. IMPRESSION: 1. Negative examination for pulmonary embolism. 2. Moderate bilateral pleural effusions and associated atelectasis or consolidation. 3. Diffuse bilateral bronchial wall thickening, most likely reflecting edema. 4. Cardiomegaly and coronary artery disease. Aortic Atherosclerosis (ICD10-I70.0). Electronically Signed   By: Delanna Ahmadi M.D.   On: 07/12/2021 15:09     EKG: Independently reviewed, with result as described above.    Assessment/Plan    Principal Problem:   Acute on chronic combined systolic and diastolic CHF (congestive heart failure) (HCC) Active Problems:   Tobacco abuse   Pancreatic adenocarcinoma (HCC)   SOB (shortness of breath)   Elevated troponin     #) Acute on chronic systolic/diastolic heart failure: dx of acute decompensation on the basis of presenting 3 to 4 days of progressive shortness breath associated with orthopnea, new onset nonproductive cough, worsening of edema in the bilateral lower extremities, as well as imaging revealing evidence of pulmonary vascular congestion as well as pulmonary edema, as further detailed above. This is in the context of a known history of chronic systolic/diastolic heart failure, with most recent echocardiogram performed in February 2022 notable for LVEF 25 to 30% as well as grade 3 diastolic dysfunction, with additional details as conveyed above. I suspect that mildly elevated initial troponin is a consequence of underlying acutely decompensated heart failure as opposed to representing ACS causing presenting acute heart failure  exacerbation, particularly in the absence of any recent CP, and with presenting EKG showing no evidence of acute ischemic changes. However, will continue to evaluate with further trending of troponin and close monitoring on tele. Patient conveys good compliance with home diuretic therapy, which consists of Lasix 40 mg p.o. daily, as well as good compliance with their additional home cardiac medications, which include Entresto, with recent associated dose reduction and as noted above.  Of note, does not appear the patient is on a beta-blocker at home.  Consequently, in the setting of presenting acute decompensated heart failure, will refrain from initiation of such for now.  As noted above, cardiology has been consulted, with recommendation for admission to the hospitalist service for further evaluation management of acute on chronic systolic/diastolic heart failure, including need for additional IV diuresis, with plan for cardiology to formally consult.   Of note, patient received Lasix 40 mg IV x1 while in the ED today. Of note, I utilized the Heart Failure order set to assist with my placement of orders on this patient.     Plan: monitor strict I's & O's and daily weights. Monitor on telemetry, including trend in HR in response to diuresis, as above. Monitor continuous pulse oximetry. Repeat BMP in the morning, including for monitoring trend of potassium, bicarbonate, and renal function in response to interval diuresis efforts. Add-on serum magnesium level,  and repeat this level in the AM.  In the setting of presenting serum potassium level 3.7 with plan for additional IV diuresis, have ordered potassium chloride 40 mEq p.o. x1 now.  Close monitoring of ensuing blood pressure response to diuresis efforts, including to help guide need for improvement in afterload reduction in order to optimize cardiac output.  Continue home dose of Entresto.  Trend troponin. Lasix 40 mg IV twice daily.  Cardiology  consulted, as above, with additional recs pending at this time.  Will refrain from ordering echocardiogram at this time, pending cardiology recommendations.     #) Elevated troponin: mildly elevated initial troponin of 36, followed by repeat value trending up slightly at 38.  Suspect that this mildly elevated troponin is on the basis of supply demand mismatch in the setting of acute on chronic systolic/diastolic heart failure as opposed to representing a type I process due to acute plaque rupture.  EKG shows no evidence of acute ischemic changes, including no evidence of STEMI.  Additionally, presentation is not associated with any CP. CTA chest show evidence of pulmonary edema consistent with suspected acute on chronic systolic/diastolic heart failure, showing no evidence of acute pulmonary embolism or pneumothorax.  Overall, ACS is felt to be less likely relative to type 2 supply demand mismatch, as above, but will closely monitor on telemetry overnight while treating suspected underlying acute on chronic systolic/diastolic heart failure, including additional IV diuresis,, as further described above.   Plan: repeat troponin in the AM.  Potassium chloride 40 mg p.o. x1, with repeat BMP in the morning.  Check serum magnesium level.  Repeat CBC in the AM. Additional evaluation and management of presenting acute on chronic systolic/diastolic heart failure, including need for additional IV diuresis, as outlined above.  Cardiology consulted, with additional recommendations pending at this time, including recs regarding indication for repeating echocardiogram.     #) Metastatic pancreatic cancer: Documented history of such, and complicated by prior biliary obstruction status post biliary stent in May 2021.  Liver enzymes today showed no evidence of a cholestatic process, and overall no evidence of transaminitis at this time.  Plan: Continue home pancrelipase.  Repeat CMP in the morning.      #)  Chronic tobacco abuse: Patient acknowledges that he is a current smoker, having smoked cigarettes over the course of at least the last 25 years, but conveys reduction in his smoking habits, noting that he smokes less than quarter pack per day on average.  Plan: Counseled the patient on importance of complete smoking discontinuation.     DVT prophylaxis: SCD's   Code Status: Full code Family Communication: none Disposition Plan: Per Rounding Team Consults called: cardiology consulted, as above;  Admission status: Patient; med telemetry   Of note, this patient was added by me to the following Admit List/Treatment Team:  mcadmits.   Of note, the Adult Admission Order Set (Multimorbid order set) was used by me in the admission process for this patient.  PLEASE NOTE THAT DRAGON DICTATION SOFTWARE WAS USED IN THE CONSTRUCTION OF THIS NOTE.   Rhetta Mura DO [facility] Pager [phone] From [place]   07/12/2021, 8:00 PM

## 2021-07-12 NOTE — ED Notes (Signed)
Updated pt on transport

## 2021-07-12 NOTE — ED Notes (Signed)
Pt given low sodium snacks, per MD is ok.

## 2021-07-12 NOTE — ED Notes (Signed)
Report given and care transferred to [facility]

## 2021-07-12 NOTE — ED Notes (Signed)
Report to Shanon Brow at [facility]

## 2021-07-12 NOTE — Telephone Encounter (Signed)
Spoke to patient. Since decreasing his entresto last week he is becoming more short of breath, his legs are swelling,very fatigued, unable to sleep. He states he is having a really hard time breathing. Patient unaware of any weight gain. Patient aware Dr. Agustin Cree is out on vacation. Informed him I can check with the DOD here, patient preferred to go on to the emergency department because he is not feeling well. He is going to [facility] ED. Patient not notably in respiratory distress while talking to me.

## 2021-07-12 NOTE — ED Triage Notes (Signed)
Pt c/o SOB, bilat LE swelling, fatigue x 3-4 days-NAD-steady gait-RT in triage for assessment

## 2021-07-12 NOTE — ED Notes (Signed)
Ambulated from lobby to Triage 2, HR 108, SpO2 98% on r/a.  No DOE noted.

## 2021-07-12 NOTE — ED Notes (Signed)
Pt requests to walk to restroom to defecate, insists on this. Pt walks with stedy gait, NAD.

## 2021-07-12 NOTE — Progress Notes (Signed)
Pt refusing the Covid swab, explained the protocol but pt refuse still. Pt has not received Covid Vaccine.  Pt has SOB. Paged DR Velia Meyer.

## 2021-07-12 NOTE — Telephone Encounter (Signed)
Tried to call patient back no answer.

## 2021-07-12 NOTE — ED Notes (Signed)
Patient transported to CT 

## 2021-07-12 NOTE — Consult Note (Signed)
Cardiology Consultation:   Patient ID: Brandon Schmitt MRN: [id number]; DOB: [birth year]  Admit date: 07/12/2021 Date of Consult: 07/12/2021  PCP:  Patient, No Pcp Per (Inactive)   [facility] Prov[facility] Cardiologist:  Jenne Campus, MD   {  Patient Profile:   Brandon Schmitt is a 61 y.o. 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 He is being seen 07/12/2021 for the evaluation of  heart failure at the request of Dr. Matilde Sprang.  History of Present Illness:   Brandon Schmitt is a 60 yo male with PMH noted above. He was initially diagnosed with pancreatic adenocarcinoma 02/2020 requiring stenting of the CBD and underwent 7 cycles of neoadjuvant therapy with FOLFIRINOX, but developed severe shortness of breath prior to last treatment. He was seen in the [place] ED 07/2020 with heart failure requiring BiPAP, but after receiving initial diuresis, refused admission. He was scheduled to undergo surgical excision (robot whipple) on 09/2020 but requested to cancel due to the holidays, then for further work-up of his heart failure. He then presented to the [facility] ED with CHF in 10/2020 and was treated with IV Lasix and then discharged. He was referred to Cardiology and seen by Dr. 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 Echo in 11/2020 showed severely dilated LV with EF of 25-30%, restrictive filling, severely decreased stroke volume, moderately dilated RV with mildly reduced systolic function, severe biatrial enlargement, moderate MR, and mild-mod TR. He was started on Entresto 24-26mg  twice daily. Coreg was also attempted but he did not tolerate this. It seems has has now been lost to follow-up by [facility] oncology and has struggled to accept  his cancer diagnosis. He was last seen by Dr. Agustin Cree in 04/2021 at which time he was overall stable from a cardiac standpoint. Entresto was increased, but decreased back to 24-26mg  Oct 5 as he felt "congested" and wasn't sure if the higher dose of Entresto was contributing.  For the past 3-4 days following the decrease in Entresto dosing, he has had worsening dyspnea, orthopnea, lower extremity swelling, and fatigue. In particular, unable to sleep due to orthopnea and PND. He continues to smoke but reports compliance with medication and denies any illicit drug use. He does not weight himself at home.  He presented to the [facility] ED today for further evaluation where he was found to be hypervolemic with tachypnea, borderline tachycardia, and severe diastolic hypertension. EKG showed no significant changes compared to last tracing in July. Chest x-ray consistent with pulmonary vascular congestion and mild pulmonary edema. CTA negative for PE but did show moderate bilateral pleural effusions with associated atelectasis. High-sensitivity troponin minimally elevated and flat at 36->38. CMP and CBC, including bilirubin, were normal. Lasix 40mg  IV given in the ED and he was transferred here for further management. On arrival he is comfortable, watching football, states he filled up multiple urinals with the first Lasix dose. Feels nearly back to baseline now.    Past Medical History:  Diagnosis Date   Alcohol use 02/13/2020   Blood clotting disorder ([place]) 07/06/2016   Cancer ([place])    Cholelithiasis 02/13/2020   Congestive heart failure (CHF) ([place]) 12/09/2020   Dilated cardiomyopathy ([place]) 12/09/2020   Elevated transaminase level 02/13/2020   Pancreatic adenocarcinoma ([place]) 03/31/2020  Tobacco abuse 02/13/2020   Total bilirubin, elevated 02/13/2020    Past Surgical History:  Procedure Laterality Date   BILIARY STENT PLACEMENT N/A 02/19/2020   Procedure: BILIARY STENT PLACEMENT;  Surgeon: Arta Silence, MD;  Location: [facility] END[facility]COPY;  Service: Endoscopy;  Laterality: N/A;   ENDOSCOPIC RETROGRADE CHOLANGIOPANCREATOGRAPHY (ERCP) WITH PROPOFOL N/A 02/19/2020   Procedure: ENDOSCOPIC RETROGRADE CHOLANGIOPANCREATOGRAPHY (ERCP) WITH PROPOFOL;  Surgeon: Arta Silence, MD;  Location: [facility];  Service: Endoscopy;  Laterality: N/A;   ENDOSCOPIC RETROGRADE CHOLANGIOPANCREATOGRAPHY (ERCP) WITH PROPOFOL N/A 02/19/2020   Procedure: ENDOSCOPIC RETROGRADE CHOLANGIOPANCREATOGRAPHY (ERCP) WITH PROPOFOL;  Surgeon: Clarene Essex, MD;  Location: [facility];  Service: Endoscopy;  Laterality: N/A;  EUS FIRST THEN ERCP   ESOPHAGOGASTRODUODENOSCOPY (EGD) WITH PROPOFOL N/A 02/19/2020   Procedure: ESOPHAGOGASTRODUODENOSCOPY (EGD) WITH PROPOFOL;  Surgeon: Arta Silence, MD;  Location: [facility];  Service: Endoscopy;  Laterality: N/A;   EUS N/A 02/19/2020   Procedure: UPPER ENDOSCOPIC ULTRASOUND (EUS) LINEAR;  Surgeon: Arta Silence, MD;  Location: [facility];  Service: Endoscopy;  Laterality: N/A;  EUS FIRST THEN ERCP with DR MAGOD   FINE NEEDLE ASPIRATION N/A 02/19/2020   Procedure: FINE NEEDLE ASPIRATION (FNA) LINEAR;  Surgeon: Arta Silence, MD;  Location: [facility];  Service: Endoscopy;  Laterality: N/A;   SPHINCTEROTOMY  02/19/2020   Procedure: SPHINCTEROTOMY;  Surgeon: Arta Silence, MD;  Location: [facility];  Service: Endoscopy;;     Home Medications:  Prior to Admission medications   Medication Sig Start Date End Date Taking? Authorizing Provider  furosemide (LASIX) 40 MG tablet Take 1 tablet (40 mg total) by mouth daily. 11/30/20 04/22/21  Richardo Priest, MD  Multiple Vitamins-Minerals (MULTIVITAMIN WITH MINERALS) tablet Take 1 tablet by mouth daily. Unknown strength    [provider]  Pancrelipase, Lip-Prot-Amyl, (PANCREAZE) 37000-97300 units CPEP Take 1 tablet by mouth daily. 11/27/20   [provider]  sacubitril-valsartan (ENTRESTO) 24-26 MG Take 1 tablet by mouth 2  (two) times daily. 07/07/21   Park Liter, MD   Allergies:    Allergies  Allergen Reactions   Irinotecan Other (See Comments) and Shortness Of Breath    Tachycardia, hypertension, respiratory distress with productive cough; (onset of symptoms was 30 minutes after oxaliplatin infusion and 30 minutes into the irinotecan infusion) cycle 7 of 8,    Social History:   Social History   Socioeconomic History   Marital status: Single    Spouse name: Not on file   Number of children: 5   Years of education: Not on file   Highest education level: Not on file  Occupational History   Not on file  Tobacco Use   Smoking status: Every Day    Packs/day: 0.00    Years: 25.00    Pack years: 0.00    Types: Cigarettes, Cigars   Smokeless tobacco: Never   Tobacco comments:    2 cigs per day per pt  Substance and Sexual Activity   Alcohol use: Yes    Comment: occ   Drug use: Yes    Types: Marijuana   Sexual activity: Not on file  Other Topics Concern   Not on file  Social History Narrative   Not on file   Social Determinants of Health   Financial Resource Strain: Not on file  Food Insecurity: Not on file  Transportation Needs: Not on file  Physical Activity: Not on file  Stress: Not on file  Social Connections: Not on file  Intimate Partner Violence: Not on file  Family History:   Family History  Problem Relation Age of Onset   Heart disease Neg Hx    Diabetes Neg Hx      ROS:  Please see the history of present illness for pertinent positives and negatives.    Physical Exam/Data:   Vitals:   07/12/21 1256 07/12/21 1321 07/12/21 1330 07/12/21 1424  BP: (!) 137/102 (!) 139/97 (!) 134/97 124/90  Pulse: 97 86 87 95  Resp: (!) 25 (!) 24 18 (!) 22  Temp:      TempSrc:      SpO2: 98% 92% 100% 100%    Intake/Output Summary (Last 24 hours) at 07/12/2021 1530 Last data filed at 07/12/2021 1454 Gross per 24 hour  Intake --  Output 300 ml  Net -300 ml   Last 3  Weights 04/22/2021 01/06/2021 12/09/2020  Weight (lbs) 165 lb 169 lb 182 lb  Weight (kg) 74.844 kg 76.658 kg 82.555 kg     There is no height or weight on file to calculate BMI.  General:  Thin male, watching football in bed, in no acute distress HEENT: slight cachexia, otherwise normal Neck: JVP is around 7 cm Vascular: No carotid bruits; Distal pulses 2+ bilaterally Cardiac:  regular rhythm, sinus on monitor. Faint systolic murmur. No gallop. Lungs:  Scattered wheezing, most prominent in left upper lung field. No rhonchi or rales.  Abd: soft, nontender, no hepatomegaly  Ext: trace edema Musculoskeletal:  No deformities, muscular atrophy present Skin: warm and dry  Neuro:  Alert and oriented, no abnormal movements or gross deficits   EKG:  The EKG was personally reviewed and demonstrates: sinus rhythm, LVH with low limb lead voltage, anterior infarct, likely inferior infarct, anterolateral TWI, nonspecific ST changes in anterior leads  Relevant CV Studies:  Echo: 11/2020  1. Left ventricular ejection fraction, by estimation, is 25 to 30%. The  left ventricle has severely decreased function. The left ventricle  demonstrates akinesis in the basal inferior,basal inferolateral, basal  inferoseptal with severe hypokinesis in the   remaining myocardial wall segments.   2. The left ventricular internal cavity size was severely dilated. Left  ventricular diastolic parameters are consistent with Grade III diastolic  dysfunction (restrictive). The average left ventricular global  longitudinal strain is abnormal ( -4.2 %).   3. Right ventricular systolic function is mildly reduced. The right  ventricular size is moderately enlarged.   4. Left atrial size was severely dilated.   5. Right atrial size was severely dilated.   6. The pericardial effusion is circumferential. There is no evidence of  cardiac tamponade.   7. The mitral valve leaflets are mildly thickened. Moderate secondary  mitral  valve regurgitation. No evidence of mitral stenosis.   8. Tricuspid valve regurgitation is mild to moderate.   9. The aortic valve has an indeterminant number of cusps. Aortic valve  regurgitation is not visualized. Mild aortic valve stenosis. Aortic valve  area, by VTI measures 1.65 cm. Aortic valve mean gradient measures 3.0  mmHg. Aortic valve Vmax measures  1.26 m/s.  10. The inferior vena cava is dilated in size with >50% respiratory  variability, suggesting right atrial pressure of 8 mmHg.  11. Mild to moderate ascites.    Laboratory Data:  High Sensitivity Troponin:   Recent Labs  Lab 07/12/21 1229  TROPONINIHS 36*     Chemistry Recent Labs  Lab 07/12/21 1229  NA 139  K 3.7  CL 105  CO2 24  GLUCOSE 139*  BUN 12  CREATININE 0.87  CALCIUM 8.9  GFRNONAA >60  ANIONGAP 10    Recent Labs  Lab 07/12/21 1229  PROT 7.3  ALBUMIN 3.7  AST 40  ALT 40  ALKPHOS 101  BILITOT 1.0   Lipids No results for input(s): CHOL, TRIG, HDL, LABVLDL, LDLCALC, CHOLHDL in the last 168 hours.  Hematology Recent Labs  Lab 07/12/21 1229  WBC 7.1  RBC 4.39  HGB 13.3  HCT 38.8*  MCV 88.4  MCH 30.3  MCHC 34.3  RDW 15.6*  PLT 248   Thyroid No results for input(s): TSH, FREET4 in the last 168 hours.  BNPNo results for input(s): BNP, PROBNP in the last 168 hours.  DDimer No results for input(s): DDIMER in the last 168 hours.   Radiology/Studies:  DG Chest 2 View  Result Date: 07/12/2021 CLINICAL DATA:  cocnern for pulm edema EXAM: CHEST - 2 VIEW COMPARISON:  09/29/2020. FINDINGS: Prominence of the hila bilaterally. Mild interstitial prominence. Small bilateral pleural effusions. Mild enlargement the cardiac silhouette, similar to prior. Right IJ central venous catheter with the tip projecting at the right atrium. No visible pneumothorax. No acute osseous abnormality. IMPRESSION: Prominence of the hila bilaterally with mild interstitial prominence and small bilateral pleural  effusions, similar to prior and possibly representing pulmonary vascular congestion and mild pulmonary edema. Electronically Signed   By: Margaretha Sheffield M.D.   On: 07/12/2021 13:38   CT Angio Chest PE W and/or Wo Contrast  Result Date: 07/12/2021 CLINICAL DATA:  Rule out PE, shortness of breath, lower extremity swelling, history of congestive heart failure and pancreatic cancer EXAM: CT ANGIOGRAPHY CHEST WITH CONTRAST TECHNIQUE: Multidetector CT imaging of the chest was performed using the standard protocol during bolus administration of intravenous contrast. Multiplanar CT image reconstructions and MIPs were obtained to evaluate the vascular anatomy. CONTRAST:  145mL OMNIPAQUE IOHEXOL 350 MG/ML SOLN COMPARISON:  09/29/2020 FINDINGS: Cardiovascular: Satisfactory opacification of the pulmonary arteries to the segmental level. No evidence of pulmonary embolism. Cardiomegaly. Three-vessel coronary artery calcifications. No pericardial effusion. Aortic atherosclerosis. Mediastinum/Nodes: No enlarged mediastinal, hilar, or axillary lymph nodes. Thyroid gland, trachea, and esophagus demonstrate no significant findings. Lungs/Pleura: Moderate bilateral pleural effusions and associated atelectasis or consolidation. Diffuse bilateral bronchial wall thickening. Upper Abdomen: No acute abnormality. Common bile duct stent partially imaged in the included upper abdomen. Musculoskeletal: No chest wall abnormality. No acute or significant osseous findings. Review of the MIP images confirms the above findings. IMPRESSION: 1. Negative examination for pulmonary embolism. 2. Moderate bilateral pleural effusions and associated atelectasis or consolidation. 3. Diffuse bilateral bronchial wall thickening, most likely reflecting edema. 4. Cardiomegaly and coronary artery disease. Aortic Atherosclerosis (ICD10-I70.0). Electronically Signed   By: Delanna Ahmadi M.D.   On: 07/12/2021 15:09     Assessment and Plan:   Acute on  chronic HFrEF due to severe biventricular DCM Moderate mitral and tricuspid regurgitation Severe diastolic hypertension Pancreatic adenocarcinoma s/p FOLFIRINOX, lost to follow-up History of LLE DVT; current CTA negative for PE Current smoker, wheezing, suspect COPD  Decompensation likely secondary to decrease in Entresto dosing and hypertension. Initially NYHA class IV symptoms, but now significantly improved with initial diuresis. Appears comfortable with nearly normalized JVP, feels almost back to baseline. BP has also improved. Overall, his care is complicated by poor insight and follow-up for other comorbidities.  - Recheck BMP/Mg now. Monitor strict I&Os and daily standing weights. - Likely only needs 1 additional dose of  Lasix 40mg  IV - Increase Entresto back to 49-51mg  BID starting  tomorrow, can given 24-26mg  dose tonight. - Would consider adding MRA and re-trail of low-dose beta blocker as outpatient.  - Not a candidate for primary prevention ICD until he re-establishes care with oncology and prognosis is clarified. He also has additional room for medical optimization and would need re-evaluation of LVEF at that time. - Discontinued further troponins, low-level troponin elevation due to heart failure. - Encouraged smoking cessation. Check U-tox.  - Has not had formal ischemic evaluation, though I would recommend medical optimization and re-establishing care with oncology prior to considering any ischemic evaluation. He does not have any angina.  - Needs PCP follow-up and evaluation for COPD. Oncology follow-up if he is willing.   For questions or updates, please contact [place] Please consult [email or web address] for contact info under    Signed, Marykay Lex, MD  07/12/2021 10:15 PM

## 2021-07-12 NOTE — ED Notes (Signed)
Pt returns, equipment and O2 reapplied.

## 2021-07-12 NOTE — ED Notes (Signed)
Pt fowlers in bed, NAD, wearing O2 via [place]. A/ox4, speaking in full and complete sentences. Pt states has had increasing SOB since Thursday with HX CHF. +orthopnea, exertional dyspnea, +2 edema to ankles. Pt denies CP, LS clear bilaterally.

## 2021-07-12 NOTE — ED Provider Notes (Signed)
[facility] EMERGENCY DEPARTMENT Provider Note   CSN: [id number] Arrival date & time: 07/12/21  1145     History Chief Complaint  Patient presents with   Shortness of Breath    Brandon Schmitt is a 60 y.o. male with PMH dilated cardiomyopathy with last EF 25 to 30%, pancreatic carcinoma not currently on chemotherapy who presents to the emergency department for evaluation of shortness of breath.  Patient states that over the last 72 hours he has had progressively worsening shortness of breath and associated lower extremity swelling.  He states that his water pill was recently reduced, but upon review of records, it appears the [place] was reduced.  He currently endorses shortness of breath, orthopnea, cough, but denies chest pain, abdominal pain, nausea, vomiting or other systemic symptoms.   Shortness of Breath Associated symptoms: no abdominal pain, no chest pain, no cough, no ear pain, no fever, no rash, no sore throat and no vomiting       Past Medical History:  Diagnosis Date   Alcohol use 02/13/2020   Blood clotting disorder ([place]) 07/06/2016   Cancer ([place])    Cholelithiasis 02/13/2020   Congestive heart failure (CHF) ([place]) 12/09/2020   Dilated cardiomyopathy ([place]) 12/09/2020   Elevated transaminase level 02/13/2020   Pancreatic adenocarcinoma ([place]) 03/31/2020   Tobacco abuse 02/13/2020   Total bilirubin, elevated 02/13/2020    Patient Active Problem List   Diagnosis Date Noted   Dilated cardiomyopathy ([place]) 12/09/2020   Congestive heart failure (CHF) ([place]) 12/09/2020   Pancreatic adenocarcinoma ([place]) 03/31/2020   Total bilirubin, elevated 02/13/2020   Elevated transaminase level 02/13/2020   Tobacco abuse 02/13/2020   Alcohol use 02/13/2020   Cholelithiasis 02/13/2020   Blood clotting disorder ([place]) 07/06/2016    Past Surgical History:  Procedure Laterality Date   BILIARY STENT PLACEMENT N/A 02/19/2020   Procedure: BILIARY STENT PLACEMENT;  Surgeon:  Arta Silence, MD;  Location: [facility];  Service: Endoscopy;  Laterality: N/A;   ENDOSCOPIC RETROGRADE CHOLANGIOPANCREATOGRAPHY (ERCP) WITH PROPOFOL N/A 02/19/2020   Procedure: ENDOSCOPIC RETROGRADE CHOLANGIOPANCREATOGRAPHY (ERCP) WITH PROPOFOL;  Surgeon: Arta Silence, MD;  Location: [facility];  Service: Endoscopy;  Laterality: N/A;   ENDOSCOPIC RETROGRADE CHOLANGIOPANCREATOGRAPHY (ERCP) WITH PROPOFOL N/A 02/19/2020   Procedure: ENDOSCOPIC RETROGRADE CHOLANGIOPANCREATOGRAPHY (ERCP) WITH PROPOFOL;  Surgeon: Clarene Essex, MD;  Location: [facility];  Service: Endoscopy;  Laterality: N/A;  EUS FIRST THEN ERCP   ESOPHAGOGASTRODUODENOSCOPY (EGD) WITH PROPOFOL N/A 02/19/2020   Procedure: ESOPHAGOGASTRODUODENOSCOPY (EGD) WITH PROPOFOL;  Surgeon: Arta Silence, MD;  Location: [facility];  Service: Endoscopy;  Laterality: N/A;   EUS N/A 02/19/2020   Procedure: UPPER ENDOSCOPIC ULTRASOUND (EUS) LINEAR;  Surgeon: Arta Silence, MD;  Location: [facility];  Service: Endoscopy;  Laterality: N/A;  EUS FIRST THEN ERCP with DR MAGOD   FINE NEEDLE ASPIRATION N/A 02/19/2020   Procedure: FINE NEEDLE ASPIRATION (FNA) LINEAR;  Surgeon: Arta Silence, MD;  Location: [facility];  Service: Endoscopy;  Laterality: N/A;   SPHINCTEROTOMY  02/19/2020   Procedure: SPHINCTEROTOMY;  Surgeon: Arta Silence, MD;  Location: [facility];  Service: Endoscopy;;       Family History  Problem Relation Age of Onset   Heart disease Neg Hx    Diabetes Neg Hx     Social History   Tobacco Use   Smoking status: Every Day    Packs/day: 0.00    Years: 25.00    Pack years: 0.00    Types: Cigarettes, Cigars   Smokeless tobacco: Never  Tobacco comments:    2 cigs per day per pt  Substance Use Topics   Alcohol use: Yes    Comment: occ   Drug use: Yes    Types: Marijuana    Home Medications Prior to Admission medications   Medication Sig Start Date End Date Taking? Authorizing Provider  furosemide  (LASIX) 40 MG tablet Take 1 tablet (40 mg total) by mouth daily. 11/30/20 04/22/21  Richardo Priest, MD  Multiple Vitamins-Minerals (MULTIVITAMIN WITH MINERALS) tablet Take 1 tablet by mouth daily. Unknown strength    [provider]  Pancrelipase, Lip-Prot-Amyl, (PANCREAZE) 37000-97300 units CPEP Take 1 tablet by mouth daily. 11/27/20   [provider]  sacubitril-valsartan (ENTRESTO) 24-26 MG Take 1 tablet by mouth 2 (two) times daily. 07/07/21   Park Liter, MD    Allergies    Irinotecan  Review of Systems   Review of Systems  Constitutional:  Negative for chills and fever.  HENT:  Negative for ear pain and sore throat.   Eyes:  Negative for pain and visual disturbance.  Respiratory:  Positive for shortness of breath. Negative for cough.   Cardiovascular:  Positive for leg swelling. Negative for chest pain and palpitations.  Gastrointestinal:  Negative for abdominal pain and vomiting.  Genitourinary:  Negative for dysuria and hematuria.  Musculoskeletal:  Negative for arthralgias and back pain.  Skin:  Negative for color change and rash.  Neurological:  Negative for seizures and syncope.  All other systems reviewed and are negative.  Physical Exam Updated Vital Signs BP (!) 139/97   Pulse 86   Temp 97.6 F (36.4 C) (Oral)   Resp (!) 24   SpO2 92%   Physical Exam Vitals and nursing note reviewed.  Constitutional:      Appearance: He is well-developed.  HENT:     Head: Normocephalic and atraumatic.  Eyes:     Conjunctiva/sclera: Conjunctivae normal.  Cardiovascular:     Rate and Rhythm: Normal rate and regular rhythm.     Heart sounds: No murmur heard. Pulmonary:     Effort: Pulmonary effort is normal. No respiratory distress.     Breath sounds: Examination of the right-lower field reveals rales. Examination of the left-lower field reveals rales. Rales present.  Abdominal:     Palpations: Abdomen is soft.     Tenderness: There is no abdominal  tenderness.  Musculoskeletal:     Cervical back: Neck supple.     Right lower leg: Tenderness present.     Left lower leg: Tenderness present.  Skin:    General: Skin is warm and dry.  Neurological:     Mental Status: He is alert.    ED Results / Procedures / Treatments   Labs (all labs ordered are listed, but only abnormal results are displayed) Labs Reviewed  COMPREHENSIVE METABOLIC PANEL - Abnormal; Notable for the following components:      Result Value   Glucose, Bld 139 (*)    All other components within normal limits  CBC WITH DIFFERENTIAL/PLATELET - Abnormal; Notable for the following components:   HCT 38.8 (*)    RDW 15.6 (*)    All other components within normal limits  TROPONIN I (HIGH SENSITIVITY) - Abnormal; Notable for the following components:   Troponin I (High Sensitivity) 36 (*)    All other components within normal limits  TROPONIN I (HIGH SENSITIVITY)    EKG None  Radiology DG Chest 2 View  Result Date: 07/12/2021 CLINICAL DATA:  cocnern  for pulm edema EXAM: CHEST - 2 VIEW COMPARISON:  09/29/2020. FINDINGS: Prominence of the hila bilaterally. Mild interstitial prominence. Small bilateral pleural effusions. Mild enlargement the cardiac silhouette, similar to prior. Right IJ central venous catheter with the tip projecting at the right atrium. No visible pneumothorax. No acute osseous abnormality. IMPRESSION: Prominence of the hila bilaterally with mild interstitial prominence and small bilateral pleural effusions, similar to prior and possibly representing pulmonary vascular congestion and mild pulmonary edema. Electronically Signed   By: Margaretha Sheffield M.D.   On: 07/12/2021 13:38    Procedures Procedures   Medications Ordered in ED Medications  furosemide (LASIX) injection 40 mg (40 mg Intravenous Given 07/12/21 1403)    ED Course  I have reviewed the triage vital signs and the nursing notes.  Pertinent labs & imaging results that were available  during my care of the patient were reviewed by me and considered in my medical decision making (see chart for details).    MDM Rules/Calculators/A&P                           Patient seen in the emergency department for evaluation of shortness of breath.  Physical exam reveals bilateral lower extremity pitting edema and rales at the bases.  Chest x-ray with pulmonary edema.  Laboratory evaluation with initial troponin 36, delta troponin 38 likely demand ischemia.  CT PE obtained in the setting of a known cancer diagnosis and shortness of breath that was reassuringly negative for PE, but does show moderate bilateral pleural effusions and pulmonary edema.  Patient has consistently remained 100% on room air but is tachypneic and oxygen was placed for comfort.  Cardiology consulted who recommended initiation of diuresis and they will evaluate the patient upon transfer to [facility]. Patient will require admission for diuresis, and 40 mg IV Lasix administered.  Patient admitted to medicine. Final Clinical Impression(s) / ED Diagnoses Final diagnoses:  Acute on chronic congestive heart failure, unspecified heart failure type [facility])    Rx / [place] Orders ED Discharge Orders     None        Geneve Kimpel, MD 07/12/21 1620

## 2021-07-12 NOTE — ED Notes (Signed)
ED Provider at bedside. Dr. Cline Cools

## 2021-07-12 NOTE — Telephone Encounter (Signed)
Advised him to call us with any further needs.

## 2021-07-12 NOTE — Telephone Encounter (Signed)
Pt c/o Shortness Of Breath: STAT if SOB developed within the last 24 hours or pt is noticeably SOB on the phone  1. Are you currently SOB (can you hear that pt is SOB on the phone)? no  2. How long have you been experiencing SOB? Since the weekend  3. Are you SOB when sitting or when up moving around? both  4. Are you currently experiencing any other symptoms? Gets hot and cold.    Patient states he has been SOB since the weekend. He states it is pretty constant and cannot get any sleep. He says he is not sure if he needs sleeping pills to help him sleep for the fatigue. Patient was not audibly SOB on the phone.

## 2021-07-13 DIAGNOSIS — I5043 Acute on chronic combined systolic (congestive) and diastolic (congestive) heart failure: Secondary | ICD-10-CM | POA: Diagnosis not present

## 2021-07-13 DIAGNOSIS — I509 Heart failure, unspecified: Secondary | ICD-10-CM | POA: Diagnosis not present

## 2021-07-13 DIAGNOSIS — I5023 Acute on chronic systolic (congestive) heart failure: Secondary | ICD-10-CM

## 2021-07-13 DIAGNOSIS — R778 Other specified abnormalities of plasma proteins: Secondary | ICD-10-CM | POA: Diagnosis not present

## 2021-07-13 LAB — CBC
HCT: 37.1 % — ABNORMAL LOW (ref 39.0–52.0)
Hemoglobin: 12.8 g/dL — ABNORMAL LOW (ref 13.0–17.0)
MCH: 29.6 pg (ref 26.0–34.0)
MCHC: 34.5 g/dL (ref 30.0–36.0)
MCV: 85.9 fL (ref 80.0–100.0)
Platelets: 236 10*3/uL (ref 150–400)
RBC: 4.32 MIL/uL (ref 4.22–5.81)
RDW: 15.1 % (ref 11.5–15.5)
WBC: 6.8 10*3/uL (ref 4.0–10.5)
nRBC: 0 % (ref 0.0–0.2)

## 2021-07-13 LAB — COMPREHENSIVE METABOLIC PANEL
ALT: 33 U/L (ref 0–44)
AST: 25 U/L (ref 15–41)
Albumin: 3.3 g/dL — ABNORMAL LOW (ref 3.5–5.0)
Alkaline Phosphatase: 84 U/L (ref 38–126)
Anion gap: 8 (ref 5–15)
BUN: 12 mg/dL (ref 6–20)
CO2: 25 mmol/L (ref 22–32)
Calcium: 9 mg/dL (ref 8.9–10.3)
Chloride: 104 mmol/L (ref 98–111)
Creatinine, Ser: 1 mg/dL (ref 0.61–1.24)
GFR, Estimated: >60 mL/min (ref >60)
Glucose, Bld: 117 mg/dL — ABNORMAL HIGH (ref 70–99)
Potassium: 4.1 mmol/L (ref 3.5–5.1)
Sodium: 137 mmol/L (ref 135–145)
Total Bilirubin: 1.5 mg/dL — ABNORMAL HIGH (ref 0.3–1.2)
Total Protein: 6.3 g/dL — ABNORMAL LOW (ref 6.5–8.1)

## 2021-07-13 LAB — RAPID URINE DRUG SCREEN, HOSP PERFORMED
Amphetamines: NOT DETECTED
Barbiturates: NOT DETECTED
Benzodiazepines: NOT DETECTED
Cocaine: NOT DETECTED
Opiates: POSITIVE — AB
Tetrahydrocannabinol: POSITIVE — AB

## 2021-07-13 LAB — BRAIN NATRIURETIC PEPTIDE: B Natriuretic Peptide: >4500 pg/mL — ABNORMAL HIGH (ref 0.0–100.0)

## 2021-07-13 LAB — PHOSPHORUS: Phosphorus: 4.1 mg/dL (ref 2.5–4.6)

## 2021-07-13 LAB — HIV ANTIBODY (ROUTINE TESTING W REFLEX): HIV Screen 4th Generation wRfx: NONREACTIVE

## 2021-07-13 LAB — MAGNESIUM: Magnesium: 2 mg/dL (ref 1.7–2.4)

## 2021-07-13 MED ORDER — FUROSEMIDE 40 MG PO TABS: 20.0000 mg | ORAL_TABLET | Freq: Every day | ORAL | 0 refills | Status: DC | PRN

## 2021-07-13 MED ORDER — TRAZODONE HCL 50 MG PO TABS: 50.0000 mg | ORAL_TABLET | Freq: Every day | ORAL | 0 refills | Status: DC

## 2021-07-13 MED ORDER — ALBUTEROL SULFATE HFA 108 (90 BASE) MCG/ACT IN AERS: 1.0000 | INHALATION_SPRAY | RESPIRATORY_TRACT | 1 refills | Status: DC | PRN

## 2021-07-13 MED ORDER — TRAZODONE HCL 50 MG PO TABS: 50.0000 mg | ORAL_TABLET | Freq: Every day | ORAL | Status: DC

## 2021-07-13 MED ORDER — HYDROXYZINE HCL 10 MG PO TABS: 10.0000 mg | ORAL_TABLET | Freq: Three times a day (TID) | ORAL | Status: DC | PRN

## 2021-07-13 MED ORDER — SACUBITRIL-VALSARTAN 49-51 MG PO TABS: 1.0000 | ORAL_TABLET | Freq: Two times a day (BID) | ORAL | 3 refills | Status: DC

## 2021-07-13 MED ORDER — PANCREAZE 37000-97300 UNITS PO CPEP: 3.0000 | ORAL_CAPSULE | Freq: Three times a day (TID) | ORAL | 3 refills | Status: AC

## 2021-07-13 MED ORDER — HYDROXYZINE HCL 10 MG PO TABS: 10.0000 mg | ORAL_TABLET | Freq: Three times a day (TID) | ORAL | 0 refills | Status: DC | PRN

## 2021-07-13 MED ORDER — MELATONIN 3 MG PO TABS: 3.0000 mg | ORAL_TABLET | Freq: Every evening | ORAL | Status: DC | PRN

## 2021-07-13 MED ORDER — ALBUTEROL SULFATE HFA 108 (90 BASE) MCG/ACT IN AERS
1.0000 | INHALATION_SPRAY | RESPIRATORY_TRACT | Status: DC | PRN
  Filled 2021-07-13: qty 6.7

## 2021-07-13 NOTE — Discharge Summary (Signed)
Physician Discharge Summary  Brandon Schmitt UYQ:[id number] DOB: [birth year] DOA: 07/12/2021  PCP: Patient, No Pcp Per (Inactive)  Admit date: 07/12/2021 Discharge date: 07/13/2021  Admitted From: Home.  Disposition:  Home.   Recommendations for Outpatient Follow-up:  Follow up with PCP in 1-2 weeks Please obtain BMP/CBC in one week Please follow up with cardiology as recommended.   Discharge Condition: guarded.  CODE STATUS: full code.  Diet recommendation: Heart Healthy   Brief/Interim Summary: Brandon Schmitt is a 60 y.o. male with medical history significant for metastatic pancreatic cancer, chronic combined systolic/diastolic heart failure, chronic tobacco abuse who is admitted to [facility] on 07/12/2021 as transfer for [place] emergency department with acute on chronic systolic/diastolic heart failure after presenting from home to the latter facility complaining of shortness of breath.   Discharge Diagnoses:  Principal Problem:   Acute on chronic combined systolic and diastolic CHF (congestive heart failure) (HCC) Active Problems:   Tobacco abuse   Pancreatic adenocarcinoma (HCC)   SOB (shortness of breath)   Elevated troponin    #) Acute on chronic systolic/diastolic heart failure: dx of acute decompensation on the basis of presenting 3 to 4 days of progressive shortness breath associated with orthopnea, new onset nonproductive cough, worsening of edema in the bilateral lower extremities, as well as imaging revealing evidence of pulmonary vascular congestion as well as pulmonary edema, as further detailed above.  This is in the context of a known history of chronic systolic/diastolic heart failure, with most recent echocardiogram performed in February 2022 notable for LVEF 25 to 30% as well as grade 3 diastolic dysfunction, with additional details as conveyed above.    Cardiology consulte,d and he was started on IV lasix and diuresed appropriately.  He was  discharged on oral lasix as needed and increased the dose of entresto.  Pt adamant about going home. If we dont discharge , he has threatened to leave AMA.          #) Elevated troponin: mildly elevated initial troponin of 36, followed by repeat value trending up slightly at 38.  Suspect that this mildly elevated troponin is on the basis of supply demand mismatch in the setting of acute on chronic systolic/diastolic heart failure as opposed to representing a type I process due to acute plaque rupture.  EKG shows no evidence of acute ischemic changes, including no evidence of STEMI.  Additionally, presentation is not associated with any CP.  CTA chest show evidence of pulmonary edema consistent with suspected acute on chronic systolic/diastolic heart failure, showing no evidence of acute pulmonary embolism or pneumothorax.   Cardiology consulted, recommended no further ischemic  work up  at this time.      #) Metastatic pancreatic cancer: Documented history of such, and complicated by prior biliary obstruction status post biliary stent in May 2021.  Liver enzymes today showed no evidence of a cholestatic process, and overall no evidence of transaminitis at this time.  recommend outpatient follow up with oncology.            #) Chronic tobacco abuse:  Patient acknowledges that he is a current smoker, having smoked cigarettes over the course of at least the last 25 years, but conveys reduction in his smoking habits, .  Plan: Counseled the patient on importance of complete smoking discontinuation.      Discharge Instructions  Discharge Instructions     (HEART FAILURE PATIENTS) Call MD:  Anytime you have any of the following symptoms: 1)  3 pound weight gain in 24 hours or 5 pounds in 1 week 2) shortness of breath, with or without a dry hacking cough 3) swelling in the hands, feet or stomach 4) if you have to sleep on extra pillows at night in order to breathe.   Complete by: As directed     Diet - low sodium heart healthy   Complete by: As directed    Discharge instructions   Complete by: As directed    Please follow up with PCP in one week.      Allergies as of 07/13/2021       Reactions   Irinotecan Other (See Comments), Shortness Of Breath   Tachycardia, hypertension, respiratory distress with productive cough; (onset of symptoms was 30 minutes after oxaliplatin infusion and 30 minutes into the irinotecan infusion) cycle 7 of 8,        Medication List     STOP taking these medications    Entresto 24-26 MG Generic drug: sacubitril-valsartan Replaced by: sacubitril-valsartan 49-51 MG       TAKE these medications    albuterol 108 (90 Base) MCG/ACT inhaler Commonly known as: VENTOLIN HFA Inhale 1-2 puffs into the lungs every 4 (four) hours as needed for wheezing or shortness of breath.   aspirin EC 81 MG tablet Take 325 mg by mouth daily as needed for mild pain. Swallow whole.   furosemide 40 MG tablet Commonly known as: LASIX Take 0.5 tablets (20 mg total) by mouth daily as needed. What changed:  how much to take when to take this reasons to take this   hydrOXYzine 10 MG tablet Commonly known as: ATARAX/VISTARIL Take 1 tablet (10 mg total) by mouth 3 (three) times daily as needed for anxiety.   multivitamin with minerals tablet Take 1 tablet by mouth daily.   Pancreaze 37000-97300 units Cpep Generic drug: Pancrelipase (Lip-Prot-Amyl) Take 3 capsules by mouth with breakfast, with lunch, and with evening meal.   sacubitril-valsartan 49-51 MG Commonly known as: ENTRESTO Take 1 tablet by mouth 2 (two) times daily. Replaces: Entresto 24-26 MG   traZODone 50 MG tablet Commonly known as: DESYREL Take 1 tablet (50 mg total) by mouth at bedtime.        Follow-up Information     Park Liter, MD Follow up on 07/23/2021.   Specialty: Cardiology Why: @3 :20pm Contact information: [place]  [id number] [phone]                Allergies  Allergen Reactions   Irinotecan Other (See Comments) and Shortness Of Breath    Tachycardia, hypertension, respiratory distress with productive cough; (onset of symptoms was 30 minutes after oxaliplatin infusion and 30 minutes into the irinotecan infusion) cycle 7 of 8,    Consultations: Cardiology.    Procedures/Studies: DG Chest 2 View  Result Date: 07/12/2021 CLINICAL DATA:  cocnern for pulm edema EXAM: CHEST - 2 VIEW COMPARISON:  09/29/2020. FINDINGS: Prominence of the hila bilaterally. Mild interstitial prominence. Small bilateral pleural effusions. Mild enlargement the cardiac silhouette, similar to prior. Right IJ central venous catheter with the tip projecting at the right atrium. No visible pneumothorax. No acute osseous abnormality. IMPRESSION: Prominence of the hila bilaterally with mild interstitial prominence and small bilateral pleural effusions, similar to prior and possibly representing pulmonary vascular congestion and mild pulmonary edema. Electronically Signed   By: Margaretha Sheffield M.D.   On: 07/12/2021 13:38   CT Angio Chest PE W and/or Wo Contrast  Result Date: 07/12/2021 CLINICAL DATA:  Rule out PE, shortness of breath, lower extremity swelling, history of congestive heart failure and pancreatic cancer EXAM: CT ANGIOGRAPHY CHEST WITH CONTRAST TECHNIQUE: Multidetector CT imaging of the chest was performed using the standard protocol during bolus administration of intravenous contrast. Multiplanar CT image reconstructions and MIPs were obtained to evaluate the vascular anatomy. CONTRAST:  119mL OMNIPAQUE IOHEXOL 350 MG/ML SOLN COMPARISON:  09/29/2020 FINDINGS: Cardiovascular: Satisfactory opacification of the pulmonary arteries to the segmental level. No evidence of pulmonary embolism. Cardiomegaly. Three-vessel coronary artery calcifications. No pericardial effusion. Aortic atherosclerosis. Mediastinum/Nodes: No  enlarged mediastinal, hilar, or axillary lymph nodes. Thyroid gland, trachea, and esophagus demonstrate no significant findings. Lungs/Pleura: Moderate bilateral pleural effusions and associated atelectasis or consolidation. Diffuse bilateral bronchial wall thickening. Upper Abdomen: No acute abnormality. Common bile duct stent partially imaged in the included upper abdomen. Musculoskeletal: No chest wall abnormality. No acute or significant osseous findings. Review of the MIP images confirms the above findings. IMPRESSION: 1. Negative examination for pulmonary embolism. 2. Moderate bilateral pleural effusions and associated atelectasis or consolidation. 3. Diffuse bilateral bronchial wall thickening, most likely reflecting edema. 4. Cardiomegaly and coronary artery disease. Aortic Atherosclerosis (ICD10-I70.0). Electronically Signed   By: Delanna Ahmadi M.D.   On: 07/12/2021 15:09      Subjective: No new complaints.   Discharge Exam: Vitals:   07/13/21 0009 07/13/21 0702  BP: (!) 128/96 (!) 143/112  Pulse: 96 84  Resp:  18  Temp: 98.7 F (37.1 C) 98.2 F (36.8 C)  SpO2: 100% 100%   Vitals:   07/12/21 1815 07/12/21 2001 07/13/21 0009 07/13/21 0702  BP: (!) 135/107 (!) 136/97 (!) 128/96 (!) 143/112  Pulse: 99 (!) 102 96 84  Resp: 20 (!) 22  18  Temp:  97.9 F (36.6 C) 98.7 F (37.1 C) 98.2 F (36.8 C)  TempSrc:  Oral Oral   SpO2: 100% 100% 100% 100%  Weight:    78.1 kg    General: Pt is alert, awake, not in acute distress Cardiovascular: RRR, S1/S2 +, no rubs, no gallops Respiratory: CTA bilaterally, no wheezing, no rhonchi Abdominal: Soft, NT, ND, bowel sounds + Extremities: no edema, no cyanosis    The results of significant diagnostics from this hospitalization (including imaging, microbiology, ancillary and laboratory) are listed below for reference.     Microbiology: No results found for this or any previous visit (from the past 240 hour(s)).   Labs: BNP (last 3  results) Recent Labs    09/29/20 1527 11/30/20 1706 07/13/21 0158  BNP 1,366.4* 1,614.7* >1,950.9*   Basic Metabolic Panel: Recent Labs  Lab 07/12/21 1229 07/12/21 2007 07/13/21 0158  NA 139  --  137  K 3.7  --  4.1  CL 105  --  104  CO2 24  --  25  GLUCOSE 139*  --  117*  BUN 12  --  12  CREATININE 0.87  --  1.00  CALCIUM 8.9  --  9.0  MG  --  2.2 2.0  PHOS  --   --  4.1   Liver Function Tests: Recent Labs  Lab 07/12/21 1229 07/13/21 0158  AST 40 25  ALT 40 33  ALKPHOS 101 84  BILITOT 1.0 1.5*  PROT 7.3 6.3*  ALBUMIN 3.7 3.3*   No results for input(s): LIPASE, AMYLASE in the last 168 hours. No results for input(s): AMMONIA in the last 168 hours. CBC: Recent Labs  Lab 07/12/21 1229 07/13/21 0158  WBC  7.1 6.8  NEUTROABS 5.4  --   HGB 13.3 12.8*  HCT 38.8* 37.1*  MCV 88.4 85.9  PLT 248 236   Cardiac Enzymes: No results for input(s): CKTOTAL, CKMB, CKMBINDEX, TROPONINI in the last 168 hours. BNP: Invalid input(s): POCBNP CBG: No results for input(s): GLUCAP in the last 168 hours. D-Dimer No results for input(s): DDIMER in the last 72 hours. Hgb A1c No results for input(s): HGBA1C in the last 72 hours. Lipid Profile No results for input(s): CHOL, HDL, LDLCALC, TRIG, CHOLHDL, LDLDIRECT in the last 72 hours. Thyroid function studies No results for input(s): TSH, T4TOTAL, T3FREE, THYROIDAB in the last 72 hours.  Invalid input(s): FREET3 Anemia work up No results for input(s): VITAMINB12, FOLATE, FERRITIN, TIBC, IRON, RETICCTPCT in the last 72 hours. Urinalysis No results found for: COLORURINE, APPEARANCEUR, LABSPEC, [place], GLUCOSEU, HGBUR, BILIRUBINUR, KETONESUR, PROTEINUR, UROBILINOGEN, NITRITE, LEUKOCYTESUR Sepsis Labs Invalid input(s): PROCALCITONIN,  WBC,  LACTICIDVEN Microbiology No results found for this or any previous visit (from the past 240 hour(s)).   Time coordinating discharge: 38 minutes.   SIGNED:   Hosie Poisson, MD  [facility] 07/13/2021, 9:55 PM

## 2021-07-13 NOTE — Progress Notes (Signed)
Progress Note  Patient Name: Brandon Schmitt Date of Encounter: 07/13/2021  [facility] Cardiologist: Jenne Campus, MD   Subjective   Breathing issue resolved after diuresis. No chest pain or palpitations. Wants to be discharge "otherwise I will leave AMA".   Inpatient Medications    Scheduled Meds:  lipase/protease/amylase  36,000 Units Oral Daily   sacubitril-valsartan  1 tablet Oral BID   Continuous Infusions:  PRN Meds: acetaminophen **OR** acetaminophen, melatonin   Vital Signs    Vitals:   07/12/21 1815 07/12/21 2001 07/13/21 0009 07/13/21 0702  BP: (!) 135/107 (!) 136/97 (!) 128/96 (!) 143/112  Pulse: 99 (!) 102 96 84  Resp: 20 (!) 22  18  Temp:  97.9 F (36.6 C) 98.7 F (37.1 C) 98.2 F (36.8 C)  TempSrc:  Oral Oral   SpO2: 100% 100% 100% 100%  Weight:    78.1 kg    Intake/Output Summary (Last 24 hours) at 07/13/2021 0917 Last data filed at 07/13/2021 0900 Gross per 24 hour  Intake 240 ml  Output 2600 ml  Net -2360 ml   Last 3 Weights 07/13/2021 04/22/2021 01/06/2021  Weight (lbs) 172 lb 2.9 oz 165 lb 169 lb  Weight (kg) 78.1 kg 74.844 kg 76.658 kg      Telemetry    SR - Personally Reviewed  ECG    N/A  Physical Exam   GEN: No acute distress.   Neck: No JVD Cardiac: RRR, no murmurs, rubs, or gallops.  Respiratory: Clear to auscultation bilaterally. GI: Soft, nontender, non-distended  MS: Trace edema; No deformity. Neuro:  Nonfocal  Psych: Normal affect   Labs    High Sensitivity Troponin:   Recent Labs  Lab 07/12/21 1229 07/12/21 1453  TROPONINIHS 36* 38*     Chemistry Recent Labs  Lab 07/12/21 1229 07/12/21 2007 07/13/21 0158  NA 139  --  137  K 3.7  --  4.1  CL 105  --  104  CO2 24  --  25  GLUCOSE 139*  --  117*  BUN 12  --  12  CREATININE 0.87  --  1.00  CALCIUM 8.9  --  9.0  MG  --  2.2 2.0  PROT 7.3  --  6.3*  ALBUMIN 3.7  --  3.3*  AST 40  --  25  ALT 40  --  33  ALKPHOS 101  --  84  BILITOT 1.0  --   1.5*  GFRNONAA >60  --  >60  ANIONGAP 10  --  8    Lipids No results for input(s): CHOL, TRIG, HDL, LABVLDL, LDLCALC, CHOLHDL in the last 168 hours.  Hematology Recent Labs  Lab 07/12/21 1229 07/13/21 0158  WBC 7.1 6.8  RBC 4.39 4.32  HGB 13.3 12.8*  HCT 38.8* 37.1*  MCV 88.4 85.9  MCH 30.3 29.6  MCHC 34.3 34.5  RDW 15.6* 15.1  PLT 248 236   Thyroid No results for input(s): TSH, FREET4 in the last 168 hours.  BNP Recent Labs  Lab 07/13/21 0158  BNP >4,500.0*    DDimer No results for input(s): DDIMER in the last 168 hours.   Radiology    DG Chest 2 View  Result Date: 07/12/2021 CLINICAL DATA:  cocnern for pulm edema EXAM: CHEST - 2 VIEW COMPARISON:  09/29/2020. FINDINGS: Prominence of the hila bilaterally. Mild interstitial prominence. Small bilateral pleural effusions. Mild enlargement the cardiac silhouette, similar to prior. Right IJ central venous catheter with the tip projecting at  the right atrium. No visible pneumothorax. No acute osseous abnormality. IMPRESSION: Prominence of the hila bilaterally with mild interstitial prominence and small bilateral pleural effusions, similar to prior and possibly representing pulmonary vascular congestion and mild pulmonary edema. Electronically Signed   By: Margaretha Sheffield M.D.   On: 07/12/2021 13:38   CT Angio Chest PE W and/or Wo Contrast  Result Date: 07/12/2021 CLINICAL DATA:  Rule out PE, shortness of breath, lower extremity swelling, history of congestive heart failure and pancreatic cancer EXAM: CT ANGIOGRAPHY CHEST WITH CONTRAST TECHNIQUE: Multidetector CT imaging of the chest was performed using the standard protocol during bolus administration of intravenous contrast. Multiplanar CT image reconstructions and MIPs were obtained to evaluate the vascular anatomy. CONTRAST:  198mL OMNIPAQUE IOHEXOL 350 MG/ML SOLN COMPARISON:  09/29/2020 FINDINGS: Cardiovascular: Satisfactory opacification of the pulmonary arteries to the  segmental level. No evidence of pulmonary embolism. Cardiomegaly. Three-vessel coronary artery calcifications. No pericardial effusion. Aortic atherosclerosis. Mediastinum/Nodes: No enlarged mediastinal, hilar, or axillary lymph nodes. Thyroid gland, trachea, and esophagus demonstrate no significant findings. Lungs/Pleura: Moderate bilateral pleural effusions and associated atelectasis or consolidation. Diffuse bilateral bronchial wall thickening. Upper Abdomen: No acute abnormality. Common bile duct stent partially imaged in the included upper abdomen. Musculoskeletal: No chest wall abnormality. No acute or significant osseous findings. Review of the MIP images confirms the above findings. IMPRESSION: 1. Negative examination for pulmonary embolism. 2. Moderate bilateral pleural effusions and associated atelectasis or consolidation. 3. Diffuse bilateral bronchial wall thickening, most likely reflecting edema. 4. Cardiomegaly and coronary artery disease. Aortic Atherosclerosis (ICD10-I70.0). Electronically Signed   By: Delanna Ahmadi M.D.   On: 07/12/2021 15:09    Cardiac Studies   None this admission   Patient Profile     60 y.o. male with a history of HTN, LLE DVT, smoking, and pancreatic adenocarcinoma s/p neoadjuvant FOLFIRINOX in 2021 c/b neuropathy, now lost to follow up with [facility] oncology after diagnosis of HFrEF due to severe DCM (EF 25-30%) early 2022, presumed to be chemo-related. He is being seen 07/12/2021 for the evaluation of  heart failure at the request of Dr. Matilde Sprang.  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  - He was given IV lasix 40mg  x 2. Diuresed 2L. Breathing back to normal.  - Agree with increase dose of Entresto at 49-51mg  BID - Agree with  consult note "Not a candidate for primary prevention ICD until he re-establishes care with oncology and prognosis is clarified. He also has additional room for medical optimization and would need re-evaluation of LVEF at that time. Has not had formal ischemic evaluation, though I would recommend medical optimization and re-establishing care with oncology prior to considering any ischemic evaluation. He does not have any angina".  Patient wants "to discharge otherwise leaving AMA".    [facility] will sign off.   Medication Recommendations:  Entresto 49-51mg  BID and PRN lasix 20mg  daily Other recommendations (labs, testing, etc):  Heart failure education written in Dc instruction  Follow up as an outpatient:  Dr. Darl Householder cardiologist on 10/21 (already scheduled)   For questions or updates, please contact [place] Please consult [email or web address] for contact info under        SignedLeanor Kail, PA  07/13/2021, 9:17 AM

## 2021-07-13 NOTE — Discharge Instructions (Signed)
   Weigh yourself EVERY morning after you go to the bathroom but before you eat or drink anything. Write this number down in a weight log/diary. If you gain 3 pounds overnight or 5 pounds in a week, call the office. Take your medicines as prescribed. If you have concerns about your medications, please call us before you stop taking them.  Eat low salt foods--Limit salt (sodium) to 2000 mg per day. This will help prevent your body from holding onto fluid. Read food labels as many processed foods have a lot of sodium, especially canned goods and prepackaged meats. If you would like some assistance choosing low sodium foods, we would be happy to set you up with a nutritionist. Stay as active as you can everyday. Staying active will give you more energy and make your muscles stronger. Start with 5 minutes at a time and work your way up to 30 minutes a day. Break up your activities--do some in the morning and some in the afternoon. Start with 3 days per week and work your way up to 5 days as you can.  If you have chest pain, feel short of breath, dizzy, or lightheaded, STOP. If you don't feel better after a short rest, call 911. If you do feel better, call the office to let us know you have symptoms with exercise. Limit all fluids for the day to less than 2 liters. Fluid includes all drinks, coffee, juice, ice chips, soup, jello, and all other liquids.

## 2021-07-23 ENCOUNTER — Other Ambulatory Visit: Payer: Self-pay

## 2021-07-23 ENCOUNTER — Encounter: Payer: Self-pay | Admitting: Cardiology

## 2021-07-23 ENCOUNTER — Ambulatory Visit (INDEPENDENT_AMBULATORY_CARE_PROVIDER_SITE_OTHER): Payer: Managed Care, Other (non HMO) | Admitting: Cardiology

## 2021-07-23 VITALS — BP 90/56 | HR 56 | Ht 74.0 in | Wt 160.0 lb

## 2021-07-23 DIAGNOSIS — I420 Dilated cardiomyopathy: Secondary | ICD-10-CM | POA: Diagnosis not present

## 2021-07-23 DIAGNOSIS — C259 Malignant neoplasm of pancreas, unspecified: Secondary | ICD-10-CM

## 2021-07-23 DIAGNOSIS — R0602 Shortness of breath: Secondary | ICD-10-CM | POA: Diagnosis not present

## 2021-07-23 MED ORDER — ENTRESTO 24-26 MG PO TABS: 1.0000 | ORAL_TABLET | Freq: Two times a day (BID) | ORAL | 1 refills | Status: DC

## 2021-07-23 NOTE — Progress Notes (Signed)
Cardiology Office Note:    Date:  07/23/2021   ID:  Brandon Schmitt, DOB 1961-04-17, MRN [id number]  PCP:  Patient, No Pcp Per (Inactive)  Cardiologist:  Jenne Campus, MD    Referring MD: No ref. provider found   Chief Complaint  Patient presents with   Fatigue   Shortness of Breath   insomia    History of Present Illness:    Brandon Schmitt is a 60 y.o. male with past medical history significant for cardiomyopathy with severely reduced left ventricle ejection fraction 25 to 30%.  He apparently also got metastatic carcinoma of the pancreas however does not want to accept the fact that he had this problem.  He has been very difficult to follow him up he get his whole idea about problems that he is suffering from.  Recently he ended up going to [facility] because of decompensated CHF.  He was diuresed, he was put on Entresto and discharged home however he does very poorly he complained of feeling weak tired exhausted no energy swelling of lower extremities still there.  Denies have any chest pain tightness squeezing pressure burning chest.  Past Medical History:  Diagnosis Date   Alcohol use 02/13/2020   Blood clotting disorder ([place]) 07/06/2016   Cancer ([place])    Cholelithiasis 02/13/2020   Congestive heart failure (CHF) ([place]) 12/09/2020   Dilated cardiomyopathy ([place]) 12/09/2020   Elevated transaminase level 02/13/2020   Pancreatic adenocarcinoma ([place]) 03/31/2020   Tobacco abuse 02/13/2020   Total bilirubin, elevated 02/13/2020    Past Surgical History:  Procedure Laterality Date   BILIARY STENT PLACEMENT N/A 02/19/2020   Procedure: BILIARY STENT PLACEMENT;  Surgeon: Arta Silence, MD;  Location: [facility];  Service: Endoscopy;  Laterality: N/A;   ENDOSCOPIC RETROGRADE CHOLANGIOPANCREATOGRAPHY (ERCP) WITH PROPOFOL N/A 02/19/2020   Procedure: ENDOSCOPIC RETROGRADE CHOLANGIOPANCREATOGRAPHY (ERCP) WITH PROPOFOL;  Surgeon: Arta Silence, MD;  Location: [facility];  Service: Endoscopy;   Laterality: N/A;   ENDOSCOPIC RETROGRADE CHOLANGIOPANCREATOGRAPHY (ERCP) WITH PROPOFOL N/A 02/19/2020   Procedure: ENDOSCOPIC RETROGRADE CHOLANGIOPANCREATOGRAPHY (ERCP) WITH PROPOFOL;  Surgeon: Clarene Essex, MD;  Location: [facility];  Service: Endoscopy;  Laterality: N/A;  EUS FIRST THEN ERCP   ESOPHAGOGASTRODUODENOSCOPY (EGD) WITH PROPOFOL N/A 02/19/2020   Procedure: ESOPHAGOGASTRODUODENOSCOPY (EGD) WITH PROPOFOL;  Surgeon: Arta Silence, MD;  Location: [facility];  Service: Endoscopy;  Laterality: N/A;   EUS N/A 02/19/2020   Procedure: UPPER ENDOSCOPIC ULTRASOUND (EUS) LINEAR;  Surgeon: Arta Silence, MD;  Location: [facility];  Service: Endoscopy;  Laterality: N/A;  EUS FIRST THEN ERCP with DR MAGOD   FINE NEEDLE ASPIRATION N/A 02/19/2020   Procedure: FINE NEEDLE ASPIRATION (FNA) LINEAR;  Surgeon: Arta Silence, MD;  Location: [facility];  Service: Endoscopy;  Laterality: N/A;   SPHINCTEROTOMY  02/19/2020   Procedure: SPHINCTEROTOMY;  Surgeon: Arta Silence, MD;  Location: [facility];  Service: Endoscopy;;    Current Medications: Current Meds  Medication Sig   albuterol (VENTOLIN HFA) 108 (90 Base) MCG/ACT inhaler Inhale 1-2 puffs into the lungs every 4 (four) hours as needed for wheezing or shortness of breath.   aspirin EC 81 MG tablet Take 325 mg by mouth daily as needed for mild pain. Swallow whole.   furosemide (LASIX) 40 MG tablet Take 0.5 tablets (20 mg total) by mouth daily as needed. (Patient taking differently: Take 20 mg by mouth daily as needed for fluid or edema.)   hydrOXYzine (ATARAX/VISTARIL) 10 MG tablet Take 1 tablet (10 mg total) by mouth 3 (three) times daily  as needed for anxiety.   Multiple Vitamins-Minerals (MULTIVITAMIN WITH MINERALS) tablet Take 1 tablet by mouth daily.   Pancrelipase, Lip-Prot-Amyl, (PANCREAZE) 37000-97300 units CPEP Take 3 capsules by mouth with breakfast, with lunch, and with evening meal.   sacubitril-valsartan (ENTRESTO) 49-51 MG  Take 1 tablet by mouth 2 (two) times daily.   traZODone (DESYREL) 50 MG tablet Take 1 tablet (50 mg total) by mouth at bedtime.     Allergies:   Irinotecan   Social History   Socioeconomic History   Marital status: Single    Spouse name: Not on file   Number of children: 5   Years of education: Not on file   Highest education level: Not on file  Occupational History   Not on file  Tobacco Use   Smoking status: Every Day    Packs/day: 0.00    Years: 25.00    Pack years: 0.00    Types: Cigarettes, Cigars   Smokeless tobacco: Never   Tobacco comments:    2 cigs per day per pt  Substance and Sexual Activity   Alcohol use: Yes    Comment: occ   Drug use: Yes    Types: Marijuana   Sexual activity: Not on file  Other Topics Concern   Not on file  Social History Narrative   Not on file   Social Determinants of Health   Financial Resource Strain: Not on file  Food Insecurity: Not on file  Transportation Needs: Not on file  Physical Activity: Not on file  Stress: Not on file  Social Connections: Not on file     Family History: The patient's family history is negative for Heart disease and Diabetes. ROS:   Please see the history of present illness.    All 14 point review of systems negative except as described per history of present illness  EKGs/Labs/Other Studies Reviewed:      Recent Labs: 11/30/2020: NT-Pro BNP 4,735 07/13/2021: ALT 33; B Natriuretic Peptide >4,500.0; BUN 12; Creatinine, Ser 1.00; Hemoglobin 12.8; Magnesium 2.0; Platelets 236; Potassium 4.1; Sodium 137  Recent Lipid Panel No results found for: CHOL, TRIG, HDL, CHOLHDL, VLDL, LDLCALC, LDLDIRECT  Physical Exam:    VS:  BP (!) 90/56 (BP Location: Right Arm, Patient Position: Sitting)   Pulse (!) 56   Ht 6\' 2"  (1.88 m)   Wt 160 lb (72.6 kg)   SpO2 93%   BMI 20.54 kg/m     Wt Readings from Last 3 Encounters:  07/23/21 160 lb (72.6 kg)  07/13/21 172 lb 2.9 oz (78.1 kg)  04/22/21 165 lb  (74.8 kg)     GEN:  Well nourished, well developed in no acute distress HEENT: Normal NECK: No JVD; No carotid bruits LYMPHATICS: No lymphadenopathy CARDIAC: RRR, no murmurs, no rubs, no gallops RESPIRATORY:  Clear to auscultation without rales, wheezing or rhonchi  ABDOMEN: Soft, non-tender, non-distended MUSCULOSKELETAL:  No edema; No deformity  SKIN: Warm and dry LOWER EXTREMITIES: no swelling NEUROLOGIC:  Alert and oriented x 3 PSYCHIATRIC:  Normal affect   ASSESSMENT:    1. Dilated cardiomyopathy ([place])   2. Pancreatic adenocarcinoma ([place])   3. SOB (shortness of breath)    PLAN:    In order of problems listed above:  Dilated cardiomyopathy with severely reduced left ventricle ejection fraction.  He is blood pressure is very low I will reduce dose Entresto hopefully by that time he will feel better.  I will do Chem-7 and proBNP on him today.  I  will refer her to our advised congestive heart failure clinic. History of pancreatic adenocarcinoma.  He denies this diagnosis he does not want accept the fact that he does have it. Shortness of breath obviously related to congestive heart failure.   Medication Adjustments/Labs and Tests Ordered: Current medicines are reviewed at length with the patient today.  Concerns regarding medicines are outlined above.  No orders of the defined types were placed in this encounter.  Medication changes: No orders of the defined types were placed in this encounter.   Signed, Park Liter, MD, [facility] 07/23/2021 4:01 PM    [facility]

## 2021-07-23 NOTE — Patient Instructions (Signed)
Medication Instructions:  Your physician has recommended you make the following change in your medication:  DECREASE: Entresto 24/26 mg twice daily  *If you need a refill on your cardiac medications before your next appointment, please call your pharmacy*   Lab Work: Your physician recommends that you return for lab work today: BMP  If you have labs (blood work) drawn today and your tests are completely normal, you will receive your results only by: [place] (if you have MyChart) OR A paper copy in the mail If you have any lab test that is abnormal or we need to change your treatment, we will call you to review the results.   Testing/Procedures: None   Follow-Up: At [facility], you and your health needs are our priority.  As part of our continuing mission to provide you with exceptional heart care, we have created designated Provider Care Teams.  These Care Teams include your primary Cardiologist (physician) and Advanced Practice Providers (APPs -  Physician Assistants and Nurse Practitioners) who all work together to provide you with the care you need, when you need it.  We recommend signing up for the patient portal called "MyChart".  Sign up information is provided on this After Visit Summary.  MyChart is used to connect with patients for Virtual Visits (Telemedicine).  Patients are able to view lab/test results, encounter notes, upcoming appointments, etc.  Non-urgent messages can be sent to your provider as well.   To learn more about what you can do with MyChart, go to [email or web address].    Your next appointment:   1 month(s)  The format for your next appointment:   In Person  Provider:   Jenne Campus, MD   Other Instructions

## 2021-07-23 NOTE — Addendum Note (Signed)
Addended by: Senaida Ores on: 07/23/2021 04:07 PM   Modules accepted: Orders

## 2021-07-24 LAB — BASIC METABOLIC PANEL
BUN/Creatinine Ratio: 12 (ref 9–20)
BUN: 11 mg/dL (ref 6–24)
CO2: 25 mmol/L (ref 20–29)
Calcium: 8.7 mg/dL (ref 8.7–10.2)
Chloride: 102 mmol/L (ref 96–106)
Creatinine, Ser: 0.9 mg/dL (ref 0.76–1.27)
Glucose: 77 mg/dL (ref 70–99)
Potassium: 4.7 mmol/L (ref 3.5–5.2)
Sodium: 139 mmol/L (ref 134–144)
eGFR: 98 mL/min/{1.73_m2} (ref >59)

## 2021-08-09 ENCOUNTER — Telehealth: Payer: Self-pay

## 2021-08-09 MED ORDER — ALBUTEROL SULFATE HFA 108 (90 BASE) MCG/ACT IN AERS: 1.0000 | INHALATION_SPRAY | RESPIRATORY_TRACT | 1 refills | Status: AC | PRN

## 2021-08-09 NOTE — Telephone Encounter (Signed)
Medication filled.  

## 2021-08-09 NOTE — Telephone Encounter (Signed)
Patient states that he never received his albuterol, and would like a refill if possible sent to the [facility] in [place] at [address]. Told him it didn't look like Dr. Raliegh Ip had wrote the prescription for that but he says he did. If you have any questions give him a call back at [phone]

## 2021-08-18 ENCOUNTER — Telehealth: Payer: Self-pay | Admitting: Cardiology

## 2021-08-18 NOTE — Telephone Encounter (Signed)
New Message:    Patient have bot been able to sleep for just about a month. He would like a refill on Trazodone.   *STAT* If patient is at the pharmacy, call can be transferred to refill team.   1. Which medications need to be refilled? (please list name of each medication and dose if known)  Trazodone  2. Which pharmacy/location (including street and city if local pharmacy) is medication to be sent to? [facility], [place]  3. Do they need a 30 day or 90 day supply?

## 2021-08-19 NOTE — Telephone Encounter (Signed)
Recommendation reviewed with pt as per Dr. Wendy Poet note.  Pt verbalized understanding and had no additional questions.

## 2021-08-19 NOTE — Telephone Encounter (Signed)
Left VM to call back 

## 2021-08-31 ENCOUNTER — Other Ambulatory Visit: Payer: Self-pay

## 2021-08-31 ENCOUNTER — Encounter: Payer: Self-pay | Admitting: Cardiology

## 2021-08-31 ENCOUNTER — Ambulatory Visit: Payer: Managed Care, Other (non HMO) | Admitting: Cardiology

## 2021-08-31 VITALS — BP 100/52 | HR 62 | Ht 74.0 in | Wt 149.0 lb

## 2021-08-31 DIAGNOSIS — I420 Dilated cardiomyopathy: Secondary | ICD-10-CM

## 2021-08-31 DIAGNOSIS — I5042 Chronic combined systolic (congestive) and diastolic (congestive) heart failure: Secondary | ICD-10-CM

## 2021-08-31 DIAGNOSIS — C259 Malignant neoplasm of pancreas, unspecified: Secondary | ICD-10-CM

## 2021-08-31 DIAGNOSIS — R0602 Shortness of breath: Secondary | ICD-10-CM

## 2021-08-31 NOTE — Patient Instructions (Signed)

## 2021-08-31 NOTE — Progress Notes (Signed)
Cardiology Office Note:    Date:  08/31/2021   ID:  Brandon Schmitt, DOB 18-Jul-1961, MRN [id number]  PCP:  Patient, No Pcp Per (Inactive)  Cardiologist:  Jenne Campus, MD    Referring MD: No ref. provider found   Chief Complaint  Patient presents with   Fatigue   Weight Loss     10Lbs since last visit 07/23/2021    History of Present Illness:    Brandon Schmitt is a 60 y.o. male  with past medical history significant for cardiomyopathy with severely reduced left ventricle ejection fraction 25 to 30%.  He apparently also got metastatic carcinoma of the pancreas however does not want to accept the fact that he had this problem.  He has been very difficult to follow him up he get his whole idea about problems that he is suffering from.  Recently he ended up going to [facility] because of decompensated CHF.  He was diuresed, he was put on Entresto and discharged home however he does very poorly he complained of feeling weak tired exhausted no energy swelling of lower extremities  He comes to my office for follow-up.  He is losing weight he lost about 10 pounds he is complaining of being weak tired exhausted does have energy.  He is trying to eat a lot drink protein shakes a lot of ice cream in spite of that his weight is still going down.  Cardiac wise appears to be doing quite acceptably.  Denies having any chest pain tightness squeezing pressure burning chest shortness of breath is still there but no swelling of lower extremities.  Most concerning symptoms is weight loss as well as profound fatigue and tiredness.  Past Medical History:  Diagnosis Date   Alcohol use 02/13/2020   Blood clotting disorder ([place]) 07/06/2016   Cancer ([place])    Cholelithiasis 02/13/2020   Congestive heart failure (CHF) ([place]) 12/09/2020   Dilated cardiomyopathy ([place]) 12/09/2020   Elevated transaminase level 02/13/2020   Pancreatic adenocarcinoma ([place]) 03/31/2020   Tobacco abuse 02/13/2020   Total bilirubin, elevated  02/13/2020    Past Surgical History:  Procedure Laterality Date   BILIARY STENT PLACEMENT N/A 02/19/2020   Procedure: BILIARY STENT PLACEMENT;  Surgeon: Arta Silence, MD;  [facility];  Service: Endoscopy;  Laterality: N/A;   ENDOSCOPIC RETROGRADE CHOLANGIOPANCREATOGRAPHY (ERCP) WITH PROPOFOL N/A 02/19/2020   Procedure: ENDOSCOPIC RETROGRADE CHOLANGIOPANCREATOGRAPHY (ERCP) WITH PROPOFOL;  Surgeon: Arta Silence, MD;  [facility];  Service: Endoscopy;  Laterality: N/A;   ENDOSCOPIC RETROGRADE CHOLANGIOPANCREATOGRAPHY (ERCP) WITH PROPOFOL N/A 02/19/2020   Procedure: ENDOSCOPIC RETROGRADE CHOLANGIOPANCREATOGRAPHY (ERCP) WITH PROPOFOL;  Surgeon: Clarene Essex, MD;  [facility];  Service: Endoscopy;  Laterality: N/A;  EUS FIRST THEN ERCP   ESOPHAGOGASTRODUODENOSCOPY (EGD) WITH PROPOFOL N/A 02/19/2020   Procedure: ESOPHAGOGASTRODUODENOSCOPY (EGD) WITH PROPOFOL;  Surgeon: Arta Silence, MD;  [facility];  Service: Endoscopy;  Laterality: N/A;   EUS N/A 02/19/2020   Procedure: UPPER ENDOSCOPIC ULTRASOUND (EUS) LINEAR;  Surgeon: Arta Silence, MD;  [facility];  Service: Endoscopy;  Laterality: N/A;  EUS FIRST THEN ERCP with DR MAGOD   FINE NEEDLE ASPIRATION N/A 02/19/2020   Procedure: FINE NEEDLE ASPIRATION (FNA) LINEAR;  Surgeon: Arta Silence, MD;  [facility];  Service: Endoscopy;  Laterality: N/A;   SPHINCTEROTOMY  02/19/2020   Procedure: SPHINCTEROTOMY;  Surgeon: Arta Silence, MD;  [facility];  Service: Endoscopy;;    Current Medications: Current Meds  Medication Sig   albuterol (VENTOLIN HFA) 108 (  90 Base) MCG/ACT inhaler Inhale 1-2 puffs into the lungs every 4 (four) hours as needed for wheezing or shortness of breath.   aspirin EC 81 MG tablet Take 325 mg by mouth daily as needed for mild pain. Swallow whole.   furosemide (LASIX) 40 MG tablet Take 0.5 tablets (20 mg total) by mouth daily as needed. (Patient  taking differently: Take 20 mg by mouth daily as needed for fluid or edema.)   hydrOXYzine (ATARAX/VISTARIL) 10 MG tablet Take 1 tablet (10 mg total) by mouth 3 (three) times daily as needed for anxiety.   Multiple Vitamins-Minerals (MULTIVITAMIN WITH MINERALS) tablet Take 1 tablet by mouth daily. Unknown strenght   Pancrelipase, Lip-Prot-Amyl, (PANCREAZE) 37000-97300 units CPEP Take 3 capsules by mouth with breakfast, with lunch, and with evening meal.   sacubitril-valsartan (ENTRESTO) 24-26 MG Take 1 tablet by mouth 2 (two) times daily.   traZODone (DESYREL) 50 MG tablet Take 1 tablet (50 mg total) by mouth at bedtime.     Allergies:   Irinotecan   Social History   Socioeconomic History   Marital status: Single    Spouse name: Not on file   Number of children: 5   Years of education: Not on file   Highest education level: Not on file  Occupational History   Not on file  Tobacco Use   Smoking status: Every Day    Packs/day: 0.00    Years: 25.00    Pack years: 0.00    Types: Cigarettes, Cigars   Smokeless tobacco: Never   Tobacco comments:    2 cigs per day per pt  Substance and Sexual Activity   Alcohol use: Yes    Comment: occ   Drug use: Yes    Types: Marijuana   Sexual activity: Not on file  Other Topics Concern   Not on file  Social History Narrative   Not on file   Social Determinants of Health   Financial Resource Strain: Not on file  Food Insecurity: Not on file  Transportation Needs: Not on file  Physical Activity: Not on file  Stress: Not on file  Social Connections: Not on file     Family History: The patient's family history is negative for Heart disease and Diabetes. ROS:   Please see the history of present illness.    All 14 point review of systems negative except as described per history of present illness  EKGs/Labs/Other Studies Reviewed:      Recent Labs: 11/30/2020: NT-Pro BNP 4,735 07/13/2021: ALT 33; B Natriuretic Peptide >4,500.0;  Hemoglobin 12.8; Magnesium 2.0; Platelets 236 07/23/2021: BUN 11; Creatinine, Ser 0.90; Potassium 4.7; Sodium 139  Recent Lipid Panel No results found for: CHOL, TRIG, HDL, CHOLHDL, VLDL, LDLCALC, LDLDIRECT  Physical Exam:    VS:  BP (!) 100/52 (BP Location: Right Arm, Patient Position: Sitting)   Pulse 62   Ht 6\' 2"  (1.88 m)   Wt 149 lb (67.6 kg)   SpO2 97%   BMI 19.13 kg/m     Wt Readings from Last 3 Encounters:  08/31/21 149 lb (67.6 kg)  07/23/21 160 lb (72.6 kg)  07/13/21 172 lb 2.9 oz (78.1 kg)     GEN:  Well nourished, well developed in no acute distress HEENT: Normal NECK: No JVD; No carotid bruits LYMPHATICS: No lymphadenopathy CARDIAC: RRR, holosystolic murmur grade two 1/6 basilar apex, no rubs, no gallops RESPIRATORY:  Clear to auscultation without rales, wheezing or rhonchi  ABDOMEN: Soft, non-tender, non-distended MUSCULOSKELETAL:  No edema;  No deformity  SKIN: Warm and dry LOWER EXTREMITIES: no swelling NEUROLOGIC:  Alert and oriented x 3 PSYCHIATRIC:  Normal affect   ASSESSMENT:    1. Dilated cardiomyopathy ([place])   2. Chronic combined systolic and diastolic congestive heart failure ([place])   3. Pancreatic adenocarcinoma ([place])   4. SOB (shortness of breath)    PLAN:    In order of problems listed above:  Dilated cardiomyopathy he is able to tolerate Entresto as well as small dose of diuretic.  I have no more room to put him on any additional medication because of low blood pressure.  Again he approached him today with the issue of his pancreatic cancer he still does not believe he have it however the fact that he is losing so much weight only.  He is very concerned.  I think we need to know where he was standing with his cancer in terms of management of his congestive heart failure and cardiomyopathy.  For example he deserve to have ICD implanted but with his pancreatic cancer I did not think that is an option.  He was much more open today to the conversation  he told me 1 more time why he does not believe he had a problem with the doctors were not able to find what the problem is eventually he went to [facility] and told him right away for cancer is but he did not believe them.  He accepted my offer to see another oncologist.  I will send him to see Dr. Marin Olp.  I anticipate him to have another CT of his abdomen to see if there is a progression of his lesions in pancreas if today's he may accept some offer of therapy for it.  But that this knowledge will be important to determine how aggressive we need to be with his cardiomyopathy. Chronic congestive heart failure seems to be compensated. Pancreatic adenocarcinoma but patient does not believe in the diagnosis but overall he is losing significant of weight.  Luckily he is completely asymptomatic.  Meaning there is no pain   Medication Adjustments/Labs and Tests Ordered: Current medicines are reviewed at length with the patient today.  Concerns regarding medicines are outlined above.  No orders of the defined types were placed in this encounter.  Medication changes: No orders of the defined types were placed in this encounter.   Signed, Park Liter, MD, [facility] 08/31/2021 3:53 PM    [place]

## 2021-09-02 ENCOUNTER — Encounter: Payer: Self-pay | Admitting: *Deleted

## 2021-09-02 NOTE — Progress Notes (Signed)
Called patient on 09/01/2021 at 1500. Voicemail left requesting a call back.  Called 09/02/2021 at 1030. Voicemail left requesting a call back.   Patient returned call. He declined an appointment tomorrow, and wanted to schedule for 09/10/2021.  Reached out to [name] to introduce myself as the office RN Navigator and explain our new patient process. Reviewed the reason for their referral and scheduled their new patient appointment along with labs. Provided address and directions to the office including call back phone number. Reviewed with patient any concerns they may have or any possible barriers to attending their appointment.   Informed patient about my role as a navigator and that I will meet with them prior to their New Patient appointment and more fully discuss what services I can provide. At this time patient has no further questions or needs.    Oncology Nurse Navigator Documentation  Oncology Nurse Navigator Flowsheets 09/02/2021  Navigator Follow Up Date: 09/10/2021  Navigator Follow Up Reason: New Patient Appointment  Navigator Location [facility]  Referral Date to RadOnc/MedOnc 08/31/2021  Navigator Encounter Type Introductory Phone Call  Telephone -  Multidisiplinary Clinic Type -  Patient Visit Type MedOnc  Treatment Phase Post-Tx Follow-up  Barriers/Navigation Needs Coordination of Care;Education  Education Other  Interventions Coordination of Care;Education  Acuity Level 2-Minimal Needs (1-2 Barriers Identified)  Coordination of Care Appts  Education Method Verbal  Time Spent with Patient 45

## 2021-09-10 ENCOUNTER — Encounter: Payer: Self-pay | Admitting: *Deleted

## 2021-09-10 ENCOUNTER — Inpatient Hospital Stay: Payer: Managed Care, Other (non HMO) | Attending: Hematology & Oncology

## 2021-09-10 ENCOUNTER — Inpatient Hospital Stay: Payer: Managed Care, Other (non HMO)

## 2021-09-10 ENCOUNTER — Other Ambulatory Visit: Payer: Self-pay

## 2021-09-10 ENCOUNTER — Inpatient Hospital Stay: Payer: Managed Care, Other (non HMO) | Admitting: Hematology & Oncology

## 2021-09-10 ENCOUNTER — Encounter: Payer: Self-pay | Admitting: Hematology & Oncology

## 2021-09-10 VITALS — BP 111/68 | HR 72 | Temp 98.1°F | Resp 20 | Wt 148.0 lb

## 2021-09-10 DIAGNOSIS — Z7189 Other specified counseling: Secondary | ICD-10-CM | POA: Diagnosis not present

## 2021-09-10 DIAGNOSIS — C259 Malignant neoplasm of pancreas, unspecified: Secondary | ICD-10-CM | POA: Insufficient documentation

## 2021-09-10 HISTORY — DX: Other specified counseling: Z71.89

## 2021-09-10 LAB — CMP (CANCER CENTER ONLY)
ALT: 23 U/L (ref 0–44)
AST: 21 U/L (ref 15–41)
Albumin: 3.7 g/dL (ref 3.5–5.0)
Alkaline Phosphatase: 64 U/L (ref 38–126)
Anion gap: 5 (ref 5–15)
BUN: 20 mg/dL (ref 6–20)
CO2: 29 mmol/L (ref 22–32)
Calcium: 9.2 mg/dL (ref 8.9–10.3)
Chloride: 104 mmol/L (ref 98–111)
Creatinine: 0.72 mg/dL (ref 0.61–1.24)
GFR, Estimated: >60 mL/min (ref >60)
Glucose, Bld: 127 mg/dL — ABNORMAL HIGH (ref 70–99)
Potassium: 3.8 mmol/L (ref 3.5–5.1)
Sodium: 138 mmol/L (ref 135–145)
Total Bilirubin: 0.6 mg/dL (ref 0.3–1.2)
Total Protein: 6.2 g/dL — ABNORMAL LOW (ref 6.5–8.1)

## 2021-09-10 LAB — CBC WITH DIFFERENTIAL (CANCER CENTER ONLY)
Abs Immature Granulocytes: 0.01 10*3/uL (ref 0.00–0.07)
Basophils Absolute: 0 10*3/uL (ref 0.0–0.1)
Basophils Relative: 1 %
Eosinophils Absolute: 0.1 10*3/uL (ref 0.0–0.5)
Eosinophils Relative: 1 %
HCT: 34.1 % — ABNORMAL LOW (ref 39.0–52.0)
Hemoglobin: 11.5 g/dL — ABNORMAL LOW (ref 13.0–17.0)
Immature Granulocytes: 0 %
Lymphocytes Relative: 21 %
Lymphs Abs: 0.9 10*3/uL (ref 0.7–4.0)
MCH: 30 pg (ref 26.0–34.0)
MCHC: 33.7 g/dL (ref 30.0–36.0)
MCV: 89 fL (ref 80.0–100.0)
Monocytes Absolute: 0.4 10*3/uL (ref 0.1–1.0)
Monocytes Relative: 10 %
Neutro Abs: 2.8 10*3/uL (ref 1.7–7.7)
Neutrophils Relative %: 67 %
Platelet Count: 184 10*3/uL (ref 150–400)
RBC: 3.83 MIL/uL — ABNORMAL LOW (ref 4.22–5.81)
RDW: 15.2 % (ref 11.5–15.5)
WBC Count: 4.2 10*3/uL (ref 4.0–10.5)
nRBC: 0 % (ref 0.0–0.2)

## 2021-09-10 LAB — PREALBUMIN: Prealbumin: 16 mg/dL — ABNORMAL LOW (ref 18–38)

## 2021-09-10 NOTE — Progress Notes (Signed)
Initial RN Navigator Patient Visit  Name: Brandon Schmitt Date of Referral : 08/31/2021 Diagnosis: Pancreatic Cancer  Met with patient prior to their visit with MD. Patient was diagnosed about one year ago and is s/p treatment with chemotherapy, but declined surgery. He hasn't had follow up in almost one year and he's interested in establishing care with this office. Gave patient MD and Navigator business card.   Patient is interesting in staging scans, but not until after the holidays.   Patient completed visit with Dr.Ennever.   CT scans ordered. Will follow up to schedule once PA is obtained.   Patient understands all follow up procedures and expectations. They have my number to reach out for any further clarification or additional needs.    Oncology Nurse Navigator Documentation  Oncology Nurse Navigator Flowsheets 09/10/2021  Navigator Follow Up Date: 09/15/2021  Navigator Follow Up Reason: Appointment Review  Navigator Location [facility]  Referral Date to RadOnc/MedOnc -  Navigator Encounter Type Initial MedOnc  Telephone -  Multidisiplinary Clinic Type -  Patient Visit Type MedOnc  Treatment Phase Post-Tx Follow-up  Barriers/Navigation Needs Coordination of Care;Education  Education Other  Interventions Education;Psycho-Social Support  Acuity Level 2-Minimal Needs (1-2 Barriers Identified)  Coordination of Care -  Education Method Verbal  Time Spent with Patient 30

## 2021-09-10 NOTE — Patient Instructions (Signed)

## 2021-09-10 NOTE — Progress Notes (Signed)
Referral MD  Reason for Referral: Pancreatic adenocarcinoma  Chief Complaint  Patient presents with   New Patient (Initial Visit)  : I was told I have pancreatic cancer  HPI: Brandon Schmitt is a very nice 60 year old white male.  He is originally from [place].  He has interesting history.  Of note, he has been up and down the [facility] working.  He really has done quite a bit of work for [employer]  Think his main is is working out at [employer].  Apparently, last May, he was found to have pancreatic cancer.  He apparently had I think biliary obstruction.  He did have a biliary stent placed.  He ultimately underwent a upper endoscopy with endoscopic ultrasound.  This was done on Feb 19, 2020.  The pathology report [facility] -[id number]) showed adenocarcinoma of the pancreas.  He ultimately was treated [facility].  He had neoadjuvant therapy with FOLFIRINOX.  He has 7 cycles.  After that, he stopped treatment.  It is hard to say if he had a cardiac event.  I think his last treatment was in November.  He has been seen by cardiology.  He was referred to the [place] by cardiology and [facility] because of the history of pancreatic cancer.  Of note, his last echocardiogram was back in February 2022.  This showed an ejection fraction of 25-30%.  Left ventricle was severely dilated.  Left atria and right atrium were severely dilated.  He is on medication for his heart failure.  He is does not recall when his last CT scan was.  He would like to have a CT scan to see how his cancer is.  For I can tell, he had a CT angiogram of the chest back in October.  This did not show any obvious in the chest for the upper abdomen.  He has lost a lot of weight.  He says he used to weigh 250 pounds a year ago.  He now weighs 250 pounds.  He is trying to gain some weight.  I think he is on pancreatic enzymes.  He still smokes.  He does not drink.  I do not think there is any cancer in the  family.  Overall, I would say his performance status is probably ECOG 1.   Past Medical History:  Diagnosis Date   Alcohol use 02/13/2020   Blood clotting disorder ([place]) 07/06/2016   Cancer ([place])    Cholelithiasis 02/13/2020   Congestive heart failure (CHF) ([place]) 12/09/2020   Dilated cardiomyopathy ([place]) 12/09/2020   Elevated transaminase level 02/13/2020   Pancreatic adenocarcinoma ([place]) 03/31/2020   Tobacco abuse 02/13/2020   Total bilirubin, elevated 02/13/2020  :   Past Surgical History:  Procedure Laterality Date   BILIARY STENT PLACEMENT N/A 02/19/2020   Procedure: BILIARY STENT PLACEMENT;  Surgeon: Arta Silence, MD;  [facility];  Service: Endoscopy;  Laterality: N/A;   ENDOSCOPIC RETROGRADE CHOLANGIOPANCREATOGRAPHY (ERCP) WITH PROPOFOL N/A 02/19/2020   Procedure: ENDOSCOPIC RETROGRADE CHOLANGIOPANCREATOGRAPHY (ERCP) WITH PROPOFOL;  Surgeon: Arta Silence, MD;  [facility];  Service: Endoscopy;  Laterality: N/A;   ENDOSCOPIC RETROGRADE CHOLANGIOPANCREATOGRAPHY (ERCP) WITH PROPOFOL N/A 02/19/2020   Procedure: ENDOSCOPIC RETROGRADE CHOLANGIOPANCREATOGRAPHY (ERCP) WITH PROPOFOL;  Surgeon: Clarene Essex, MD;  [facility];  Service: Endoscopy;  Laterality: N/A;  EUS FIRST THEN ERCP   ESOPHAGOGASTRODUODENOSCOPY (EGD) WITH PROPOFOL N/A 02/19/2020   Procedure: ESOPHAGOGASTRODUODENOSCOPY (EGD) WITH PROPOFOL;  Surgeon: Arta Silence, MD;  [facility];  Service: Endoscopy;  Laterality: N/A;   EUS N/A 02/19/2020   Procedure: UPPER ENDOSCOPIC ULTRASOUND (EUS) LINEAR;  Surgeon: Arta Silence, MD;  [facility];  Service: Endoscopy;  Laterality: N/A;  EUS FIRST THEN ERCP with DR MAGOD   FINE NEEDLE ASPIRATION N/A 02/19/2020   Procedure: FINE NEEDLE ASPIRATION (FNA) LINEAR;  Surgeon: Arta Silence, MD;  [facility];  Service: Endoscopy;  Laterality: N/A;   SPHINCTEROTOMY  02/19/2020   Procedure: SPHINCTEROTOMY;  Surgeon: Arta Silence, MD;  [facility];  Service: Endoscopy;;  :   Current Outpatient Medications:    albuterol (VENTOLIN HFA) 108 (90 Base) MCG/ACT inhaler, Inhale 1-2 puffs into the lungs every 4 (four) hours as needed for wheezing or shortness of breath., Disp: 18 g, Rfl: 1   aspirin EC 81 MG tablet, Take 325 mg by mouth daily as needed for mild pain. Swallow whole., Disp: , Rfl:    furosemide (LASIX) 40 MG tablet, Take 0.5 tablets (20 mg total) by mouth daily as needed. (Patient taking differently: Take 20 mg by mouth daily as needed for fluid or edema.), Disp: 45 tablet, Rfl: 0   hydrOXYzine (ATARAX/VISTARIL) 10 MG tablet, Take 1 tablet (10 mg total) by mouth 3 (three) times daily as needed for anxiety., Disp: 30 tablet, Rfl: 0   Multiple Vitamins-Minerals (MULTIVITAMIN WITH MINERALS) tablet, Take 1 tablet by mouth daily. Unknown strenght, Disp: , Rfl:    Pancrelipase, Lip-Prot-Amyl, (PANCREAZE) 37000-97300 units CPEP, Take 3 capsules by mouth with breakfast, with lunch, and with evening meal., Disp: 600 capsule, Rfl: 3   sacubitril-valsartan (ENTRESTO) 24-26 MG, Take 1 tablet by mouth 2 (two) times daily., Disp: 60 tablet, Rfl: 1   traZODone (DESYREL) 50 MG tablet, Take 1 tablet (50 mg total) by mouth at bedtime., Disp: 7 tablet, Rfl: 0:  :   Allergies  Allergen Reactions   Irinotecan Other (See Comments) and Shortness Of Breath    Tachycardia, hypertension, respiratory distress with productive cough; (onset of symptoms was 30 minutes after oxaliplatin infusion and 30 minutes into the irinotecan infusion) cycle 7 of 8,  :   Family History  Problem Relation Age of Onset   Heart disease Neg Hx    Diabetes Neg Hx   :   Social History   Socioeconomic History   Marital status: Single    Spouse name: Not on file   Number of children: 5   Years of education: Not on file   Highest education level: Not on file  Occupational History   Not on file  Tobacco Use   Smoking status:  Every Day    Packs/day: 0.00    Years: 25.00    Pack years: 0.00    Types: Cigarettes, Cigars   Smokeless tobacco: Never   Tobacco comments:    2 cigs per day per pt  Substance and Sexual Activity   Alcohol use: Yes    Comment: occ   Drug use: Yes    Types: Marijuana   Sexual activity: Not on file  Other Topics Concern   Not on file  Social History Narrative   Not on file   Social Determinants of Health   Financial Resource Strain: Not on file  Food Insecurity: Not on file  Transportation Needs: Not on file  Physical Activity: Not on file  Stress: Not on file  Social Connections: Not on file  Intimate Partner Violence: Not on file  :  Review of Systems  Constitutional:  Positive for weight loss.  HENT:  Negative.    Eyes: Negative.   Respiratory: Negative.    Cardiovascular: Negative.   Gastrointestinal: Negative.   Genitourinary: Negative.   Musculoskeletal: Negative.   Skin: Negative.   Neurological: Negative.   Endo/Heme/Allergies: Negative.   Psychiatric/Behavioral: Negative.      Exam:  @IPVITALS @ This is a thin white male in no obvious distress.  Vital signs are temperature 98.1.  Pulse 72.  Blood pressure 111/68.  Weight is 148 pounds.  Head neck exam shows no scleral icterus.  There is no ocular or oral lesions.  He has no adenopathy in the neck.  Lungs are clear to percussion and auscultation bilaterally.  Cardiac exam regular rate and rhythm with no murmurs, rubs or bruits.  Abdomen is soft.  He has decent bowel sounds.  He has no fluid wave.  There is no palpable abdominal mass.  There is no palpable liver or spleen tip.  Back exam shows no tenderness over the spine, ribs or hips.  Extremities shows no clubbing, cyanosis or edema.  He has some muscle atrophy in upper and lower extremities.  Skin exam shows no rashes, ecchymoses or petechia.  Neurological exam shows no focal neurological deficits.   Recent Labs    09/10/21 1126  WBC 4.2  HGB 11.5*  HCT  34.1*  PLT 184    Recent Labs    09/10/21 1126  NA 138  K 3.8  CL 104  CO2 29  GLUCOSE 127*  BUN 20  CREATININE 0.72  CALCIUM 9.2    Blood smear review: None  Pathology: See above    Assessment and Plan: Brandon Schmitt is very nice 60 year old white male.  He has.  Looks like he had localized pancreatic cancer.  Neoadjuvant therapy.  Then, looks like he may have had a cardiac issue.  He does have poor cardiac function by his last echocardiogram.  He is being followed by cardiology.  We will have to get a CT scan to see what is going on.  Again, I suspect he probably does not have metastatic disease.  Weight loss could be from his treatments or could be from his heart failure and that his body is somewhat hypermetabolic.  He is not having pain.  Hopefully he will be able to gain a little weight.  We will see about getting the scan after the first of the year.  He would like this.  I will plan to see him once he has the scan done.  We will then see what we have.  Again he is very nice.  He has been quite busy with his work in the past.  He certainly did not enjoy his work.

## 2021-09-11 LAB — CANCER ANTIGEN 19-9: CA 19-9: 829 U/mL — ABNORMAL HIGH (ref 0–35)

## 2021-09-15 ENCOUNTER — Encounter: Payer: Self-pay | Admitting: *Deleted

## 2021-09-15 NOTE — Progress Notes (Signed)
CT scans scheduled for 10/11/2021.   Oncology Nurse Navigator Documentation  Oncology Nurse Navigator Flowsheets 09/15/2021  Navigator Follow Up Date: 10/11/2021  Navigator Follow Up Reason: Scan Review  Navigator Location [facility]  Referral Date to RadOnc/MedOnc -  Navigator Encounter Type Appt/Treatment Plan Review  Telephone -  Multidisiplinary Clinic Type -  Patient Visit Type MedOnc  Treatment Phase Post-Tx Follow-up  Barriers/Navigation Needs Coordination of Care;Education  Education -  Interventions None Required  Acuity Level 2-Minimal Needs (1-2 Barriers Identified)  Coordination of Care -  Education Method -  Time Spent with Patient 15

## 2021-09-30 ENCOUNTER — Ambulatory Visit (HOSPITAL_BASED_OUTPATIENT_CLINIC_OR_DEPARTMENT_OTHER)
Admission: RE | Admit: 2021-09-30 | Discharge: 2021-09-30 | Disposition: A | Discharge status: Home / Self Care | Payer: Managed Care, Other (non HMO) | Source: Ambulatory Visit | Attending: Medical | Admitting: Medical

## 2021-09-30 ENCOUNTER — Encounter: Payer: Self-pay | Admitting: Medical

## 2021-09-30 ENCOUNTER — Other Ambulatory Visit: Payer: Self-pay

## 2021-09-30 ENCOUNTER — Ambulatory Visit: Payer: Managed Care, Other (non HMO) | Admitting: Medical

## 2021-09-30 VITALS — BP 105/60 | HR 72 | Temp 98.7°F | Resp 18 | Ht 74.0 in | Wt 154.2 lb

## 2021-09-30 DIAGNOSIS — I509 Heart failure, unspecified: Secondary | ICD-10-CM | POA: Diagnosis not present

## 2021-09-30 DIAGNOSIS — R634 Abnormal weight loss: Secondary | ICD-10-CM

## 2021-09-30 DIAGNOSIS — G4700 Insomnia, unspecified: Secondary | ICD-10-CM

## 2021-09-30 DIAGNOSIS — R600 Localized edema: Secondary | ICD-10-CM | POA: Diagnosis present

## 2021-09-30 DIAGNOSIS — R739 Hyperglycemia, unspecified: Secondary | ICD-10-CM

## 2021-09-30 DIAGNOSIS — C259 Malignant neoplasm of pancreas, unspecified: Secondary | ICD-10-CM

## 2021-09-30 DIAGNOSIS — K219 Gastro-esophageal reflux disease without esophagitis: Secondary | ICD-10-CM

## 2021-09-30 DIAGNOSIS — F419 Anxiety disorder, unspecified: Secondary | ICD-10-CM

## 2021-09-30 MED ORDER — FAMOTIDINE 20 MG PO TABS: 20.0000 mg | ORAL_TABLET | Freq: Every day | ORAL | 3 refills | Status: AC

## 2021-09-30 MED ORDER — HYDROXYZINE HCL 10 MG PO TABS: 10.0000 mg | ORAL_TABLET | Freq: Three times a day (TID) | ORAL | 3 refills | Status: AC | PRN

## 2021-09-30 MED ORDER — TRAZODONE HCL 50 MG PO TABS: 25.0000 mg | ORAL_TABLET | Freq: Every evening | ORAL | 3 refills | Status: AC | PRN

## 2021-09-30 NOTE — Patient Instructions (Addendum)
Pancreatic cancer history.  Continue to follow-up with Dr. Marin Olp and you have upcoming CT studies to be done in January.  Weight loss related to the above.  Marinol might be the option to increase appetite.  Anxiety and you have hydroxyzine tablets to use 1 tablet tid as needed anxiety.  Sometimes antihistamine can increase appetite.  Sedation is common side effect.  Elevated sugar.  Recommended A1c today.  That was declined.  Please avoid excessive processed carbohydrates.  Lower and blood pressure/hypotension.  Maintain adequate hydration.  Follow cardiologist advised on using Lasix half tablet daily as needed.  CHF-clinically stable presently.  Continue Entresto.  Use Lasic prn  New onset left lower extremity swelling distal calf/pretibial area.  Remote history of DVT 3 years ago.  Placed stat order for left lower extremity ultrasound.  Presently can be done at 2 PM today or 2 PM tomorrow.  I would recommend getting it done today.  I want you to go downstairs and talk with the scheduler as there is a slight possibility could be done sooner than 2 PM today.  Possible GERD-prescribed famotidine.  Follow-up in 1 month or sooner if needed.

## 2021-09-30 NOTE — Progress Notes (Signed)
Subjective:    Patient ID: Brandon Schmitt, male    DOB: [birth year], 60 y.o.   MRN: [id number]  HPI  Pt in for first time.  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 Pt also has chf with reduced EF. On Entresto. Sees Dr. Agustin Cree. He is on lasix rarely.   Pt had cardiac arrest about one year ago but had heart failure onset after cardiac arrest per wife. Also she noted 7 round of chemotherapy is when this occurred. Formerly with [facility] but now with Dr. Marin Olp.   Taking pancreaze. He take 1 tab tid now. 3 tab caused excess gas.   "Assessment and Plan: [name] is very nice 60 year old white male.  He has.  Looks like he had localized pancreatic cancer.  Neoadjuvant therapy.  Then, looks like he may have had a cardiac issue.  He does have poor cardiac function by his last echocardiogram.  He is being followed by cardiology.  We will have to get a CT scan to see what is going on.  Again, I suspect he probably does not have metastatic disease.  Weight loss could be from his treatments or could be from his heart failure and that his body is somewhat hypermetabolic.  He is not having pain.  Hopefully he will be able to gain a little weight.  We will see about getting the scan after the first of the year.  He would like this.  I will plan to see him once he has the scan done.  We will then see what we have.  Again he is very nice.  He has been quite busy with his work in the past.  He certainly did not enjoy his work."  Pt has ct chest abdomen scheduled.  Pt was given trazadone for insomnia. He used in past brieflly for 7 days and helped him sleep.   Anxiety- he uses hydroxyzine on prn basis.   Pt has been trying to gain wt. Dr. Marin Olp had been trying to prescribe. Upcoming appt after CT.  Mild upset stomach after eating. Mild burning sensation.    Review of Systems  Constitutional:  Negative for chills, fatigue and fever.  Respiratory:  Negative for chest tightness, shortness  of breath and wheezing.   Cardiovascular:  Negative for chest pain and palpitations.  Gastrointestinal:  Positive for abdominal distention. Negative for abdominal pain, anal bleeding, blood in stool, constipation, diarrhea and nausea.  Genitourinary:  Negative for enuresis and frequency.  Musculoskeletal:  Negative for back pain and joint swelling.  Skin:  Negative for rash.  Neurological:  Negative for dizziness, syncope, weakness, numbness and headaches.  Hematological:  Negative for adenopathy. Does not bruise/bleed easily.  Psychiatric/Behavioral:  Positive for sleep disturbance. Negative for behavioral problems, decreased concentration, dysphoric mood and suicidal ideas. The patient is nervous/anxious.    Past Medical History:  Diagnosis Date   Alcohol use 02/13/2020   Blood clotting disorder ([place]) 07/06/2016   Cancer ([place])    Cholelithiasis 02/13/2020   Congestive heart failure (CHF) ([place]) 12/09/2020   Dilated cardiomyopathy ([place]) 12/09/2020   Elevated transaminase level 02/13/2020   Goals of care, counseling/discussion 09/10/2021   Pancreatic adenocarcinoma ([place]) 03/31/2020   Tobacco abuse 02/13/2020   Total bilirubin, elevated 02/13/2020     Social History   Socioeconomic History   Marital status: Single    Spouse name: Not on file   Number of children: 5   Years of education: Not on file   Highest education level:  Not on file  Occupational History   Not on file  Tobacco Use   Smoking status: Every Day    Packs/day: 0.00    Years: 25.00    Pack years: 0.00    Types: Cigarettes, Cigars   Smokeless tobacco: Never   Tobacco comments:    2 cigs per day per pt  Substance and Sexual Activity   Alcohol use: Yes    Comment: occ   Drug use: Yes    Types: Marijuana   Sexual activity: Not on file  Other Topics Concern   Not on file  Social History Narrative   Not on file   Social Determinants of Health   Financial Resource Strain: Not on file  Food Insecurity: Not on file   Transportation Needs: Not on file  Physical Activity: Not on file  Stress: Not on file  Social Connections: Not on file  Intimate Partner Violence: Not on file    Past Surgical History:  Procedure Laterality Date   BILIARY STENT PLACEMENT N/A 02/19/2020   Procedure: BILIARY STENT PLACEMENT;  Surgeon: Arta Silence, MD;  Location: [facility];  Service: Endoscopy;  Laterality: N/A;   ENDOSCOPIC RETROGRADE CHOLANGIOPANCREATOGRAPHY (ERCP) WITH PROPOFOL N/A 02/19/2020   Procedure: ENDOSCOPIC RETROGRADE CHOLANGIOPANCREATOGRAPHY (ERCP) WITH PROPOFOL;  Surgeon: Arta Silence, MD;  [facility];  Service: Endoscopy;  Laterality: N/A;   ENDOSCOPIC RETROGRADE CHOLANGIOPANCREATOGRAPHY (ERCP) WITH PROPOFOL N/A 02/19/2020   Procedure: ENDOSCOPIC RETROGRADE CHOLANGIOPANCREATOGRAPHY (ERCP) WITH PROPOFOL;  Surgeon: Clarene Essex, MD;  [facility];  Service: Endoscopy;  Laterality: N/A;  EUS FIRST THEN ERCP   ESOPHAGOGASTRODUODENOSCOPY (EGD) WITH PROPOFOL N/A 02/19/2020   Procedure: ESOPHAGOGASTRODUODENOSCOPY (EGD) WITH PROPOFOL;  Surgeon: Arta Silence, MD;  [facility];  Service: Endoscopy;  Laterality: N/A;   EUS N/A 02/19/2020   Procedure: UPPER ENDOSCOPIC ULTRASOUND (EUS) LINEAR;  Surgeon: Arta Silence, MD;  [facility];  Service: Endoscopy;  Laterality: N/A;  EUS FIRST THEN ERCP with DR MAGOD   FINE NEEDLE ASPIRATION N/A 02/19/2020   Procedure: FINE NEEDLE ASPIRATION (FNA) LINEAR;  Surgeon: Arta Silence, MD;  [facility];  Service: Endoscopy;  Laterality: N/A;   SPHINCTEROTOMY  02/19/2020   Procedure: SPHINCTEROTOMY;  Surgeon: Arta Silence, MD;  [facility];  Service: Endoscopy;;    Family History  Problem Relation Age of Onset   Heart disease Neg Hx    Diabetes Neg Hx     Allergies  Allergen Reactions   Irinotecan Other (See Comments) and Shortness Of Breath    Tachycardia, hypertension, respiratory distress with  productive cough; (onset of symptoms was 30 minutes after oxaliplatin infusion and 30 minutes into the irinotecan infusion) cycle 7 of 8,    Current Outpatient Medications on File Prior to Visit  Medication Sig Dispense Refill   albuterol (VENTOLIN HFA) 108 (90 Base) MCG/ACT inhaler Inhale 1-2 puffs into the lungs every 4 (four) hours as needed for wheezing or shortness of breath. 18 g 1   aspirin EC 81 MG tablet Take 325 mg by mouth daily as needed for mild pain. Swallow whole.     furosemide (LASIX) 40 MG tablet Take 0.5 tablets (20 mg total) by mouth daily as needed. (Patient taking differently: Take 20 mg by mouth daily as needed for fluid or edema.) 45 tablet 0   hydrOXYzine (ATARAX/VISTARIL) 10 MG tablet Take 1 tablet (10 mg total) by mouth 3 (three) times daily as needed for anxiety. 30 tablet 0   Multiple Vitamins-Minerals (MULTIVITAMIN WITH MINERALS) tablet  Take 1 tablet by mouth daily. Unknown strenght     Pancrelipase, Lip-Prot-Amyl, (PANCREAZE) 37000-97300 units CPEP Take 3 capsules by mouth with breakfast, with lunch, and with evening meal. 600 capsule 3   sacubitril-valsartan (ENTRESTO) 24-26 MG Take 1 tablet by mouth 2 (two) times daily. 60 tablet 1   traZODone (DESYREL) 50 MG tablet Take 1 tablet (50 mg total) by mouth at bedtime. 7 tablet 0   No current facility-administered medications on file prior to visit.    BP 102/70 (BP Location: Right Arm, Patient Position: Sitting, Cuff Size: Normal)    Pulse 72    Temp 98.7 F (37.1 C) (Oral)    Resp 18    Ht 6\' 2"  (1.88 m)    Wt 154 lb 3.2 oz (69.9 kg)    SpO2 99%    BMI 19.80 kg/m       Objective:   Physical Exam  General- No acute distress. Pleasant patient. Neck- Full range of motion, no jvd Lungs- Clear, even and unlabored. Heart- regular rate and rythm. Neurologic- CNII- XII grossly intact.  Abdomen- soft, nt, nd, +bs. No rebound or guarding      Assessment & Plan:   Patient Instructions  Pancreatic cancer history.   Continue to follow-up with Dr. Marin Olp and you have upcoming CT studies to be done in January.  Weight loss related to the above.  Marinol might be the option to increase appetite.  Anxiety and you have hydroxyzine tablets to use 1 tablet tid as needed anxiety.  Sometimes antihistamine can increase appetite.  Sedation is common side effect.  Elevated sugar.  Recommended A1c today.  That was declined.  Please avoid excessive processed carbohydrates.  Lower and blood pressure/hypotension.  Maintain adequate hydration.  Follow cardiologist advised on using Lasix half tablet daily as needed.  CHF-clinically stable presently.  Continue Entresto.  Use Lasic prn  New onset left lower extremity swelling distal calf/pretibial area.  Remote history of DVT 3 years ago.  Placed stat order for left lower extremity ultrasound.  Presently can be done at 2 PM today or 2 PM tomorrow.  I would recommend getting it done today.  I want you to go downstairs and talk with the scheduler as there is a slight possibility could be done sooner than 2 PM today.  Follow-up in 1 month or sooner if needed.   Mackie Pai, PA-C    Time spent with patient today was 47  minutes which consisted of chart revdiew, discussing diagnosis, work up treatment and documentation.

## 2021-10-11 ENCOUNTER — Other Ambulatory Visit (HOSPITAL_BASED_OUTPATIENT_CLINIC_OR_DEPARTMENT_OTHER): Payer: Managed Care, Other (non HMO)

## 2021-10-15 ENCOUNTER — Telehealth: Payer: Self-pay | Admitting: Cardiology

## 2021-10-15 MED ORDER — FUROSEMIDE 40 MG PO TABS: 20.0000 mg | ORAL_TABLET | Freq: Every day | ORAL | 0 refills | Status: AC | PRN

## 2021-10-15 NOTE — Telephone Encounter (Signed)
°*  STAT* If patient is at the pharmacy, call can be transferred to refill team.   1. Which medications need to be refilled? (please list name of each medication and dose if known) furosemide (LASIX) 40 MG tablet  2. Which pharmacy/location (including street and city if local pharmacy) is medication to be sent to? [id number] - [place], [place] - [address] AT [address]  3. Do they need a 30 day or 90 day supply? 90 day

## 2021-10-15 NOTE — Telephone Encounter (Signed)
Refill sent in per request.  

## 2021-10-16 ENCOUNTER — Telehealth (HOSPITAL_BASED_OUTPATIENT_CLINIC_OR_DEPARTMENT_OTHER): Payer: Self-pay

## 2021-10-18 ENCOUNTER — Encounter: Payer: Self-pay | Admitting: *Deleted

## 2021-10-18 ENCOUNTER — Inpatient Hospital Stay: Payer: Managed Care, Other (non HMO)

## 2021-10-18 ENCOUNTER — Inpatient Hospital Stay: Payer: Managed Care, Other (non HMO) | Admitting: Hematology & Oncology

## 2021-10-18 ENCOUNTER — Ambulatory Visit (HOSPITAL_BASED_OUTPATIENT_CLINIC_OR_DEPARTMENT_OTHER): Admission: RE | Admit: 2021-10-18 | Payer: Managed Care, Other (non HMO) | Source: Ambulatory Visit

## 2021-10-18 NOTE — Progress Notes (Signed)
Patient cancelled today's appointment and scans due to cost of CT's. Called patient to determine his out of pocket amount and assess for any possible resources to help pay for scans. No answer; left voicemail requesting call back.   Oncology Nurse Navigator Documentation  Oncology Nurse Navigator Flowsheets 10/18/2021  Navigator Follow Up Date: -  Navigator Follow Up Reason: -  Navigator Location [facility]  Referral Date to RadOnc/MedOnc -  Navigator Encounter Type Telephone  Telephone Appt Confirmation/Clarification;Asess Navigation Needs;Outgoing Call  [facility] Type -  Patient Visit Type MedOnc  Treatment Phase Post-Tx Follow-up  Barriers/Navigation Needs Coordination of Care;Education  Education -  Interventions Other  Acuity Level 2-Minimal Needs (1-2 Barriers Identified)  Coordination of Care -  Education Method -  Time Spent with Patient 15

## 2021-10-19 ENCOUNTER — Other Ambulatory Visit: Payer: Self-pay | Admitting: Cardiology

## 2021-10-21 ENCOUNTER — Encounter: Payer: Self-pay | Admitting: *Deleted

## 2021-10-21 NOTE — Progress Notes (Signed)
Received a call back from patient. He states he cancelled his CTs and follow up due to out of pocket cost of >$1000. He states that even with a payment plan he is unable to move forward. He states he may get another insurance plan in May and can re-visit at that time. He also states that at this time, he doesn't feel well.  Reached out to our insurance and financial aid specialist. There are no funding out there for patients who are insured and not under current treatment.   Oncology Nurse Navigator Documentation  Oncology Nurse Navigator Flowsheets 10/21/2021  Navigator Follow Up Date: -  Navigator Follow Up Reason: -  Navigator Location [facility]  Referral Date to RadOnc/MedOnc -  Navigator Encounter Type Telephone  Telephone Financial Assistance;Incoming Call  [facility] Type -  Patient Visit Type MedOnc  Treatment Phase Post-Tx Follow-up  Barriers/Navigation Needs Coordination of Care;Education  Education Other  Interventions Psycho-Social Support  Acuity Level 2-Minimal Needs (1-2 Barriers Identified)  Coordination of Care -  Education Method Verbal  Time Spent with Patient 30

## 2021-11-01 ENCOUNTER — Ambulatory Visit: Payer: Managed Care, Other (non HMO) | Admitting: Medical

## 2021-11-01 VITALS — BP 100/58 | HR 78 | Resp 18 | Ht 74.0 in | Wt 149.8 lb

## 2021-11-01 DIAGNOSIS — R110 Nausea: Secondary | ICD-10-CM

## 2021-11-01 DIAGNOSIS — R634 Abnormal weight loss: Secondary | ICD-10-CM

## 2021-11-01 DIAGNOSIS — I5022 Chronic systolic (congestive) heart failure: Secondary | ICD-10-CM

## 2021-11-01 DIAGNOSIS — R600 Localized edema: Secondary | ICD-10-CM

## 2021-11-01 DIAGNOSIS — C259 Malignant neoplasm of pancreas, unspecified: Secondary | ICD-10-CM | POA: Diagnosis not present

## 2021-11-01 DIAGNOSIS — R5383 Other fatigue: Secondary | ICD-10-CM

## 2021-11-01 DIAGNOSIS — D649 Anemia, unspecified: Secondary | ICD-10-CM

## 2021-11-01 NOTE — Patient Instructions (Addendum)
Pancreatic cancer by review of chart.  Went through various rounds of treatment in past at [facility].  Dr. Marin Olp oncologist did evaluate you recently  and recommended CT abdomen and pelvis to better assess current status.  However cost was thousand dollars or more per your report.  Understand that his quite expensive for you presently.  You mention that upcoming May you might have more reasonable insurance and get a better price for future studies.  Presently we will try to find radiology service that my office you a cheaper price if you pay cash.  I think this would be beneficial if done.  If successful in providing more reasonable price then I could forward results to Dr. Marin Olp.  Severe fatigue reported.  Also you mention legs feel heavy.  You do have known very low ejection fraction in the past.  Recommend that you follow Dr. Wendy Poet recommended treatment plan.  Presently you declined's chest x-ray, BNP and CMP to assess current CHF status.  You do appear clinically stable presently but if your symptoms do worsen I would recommend that you get those studies done.  I am designating the study is as future.  Let me know if you want the blood work and I could get you scheduled in our lab.  Chest x-ray can be done without being scheduled.  Also for fatigue thought CBC, iron level and B12 would be beneficial to review.  If your signs and symptoms worsen or change please let me know.  Follow-up in 1 month or sooner if needed.

## 2021-11-01 NOTE — Progress Notes (Signed)
Subjective:    Patient ID: Brandon Schmitt, male    DOB: October 16, 1960, 61 y.o.   MRN: [id number]  HPI   Last visit A/P.  Pancreatic cancer history.  Continue to follow-up with Dr. Marin Olp and you have upcoming CT studies to be done in January.   Weight loss related to the above.  Marinol might be the option to increase appetite.   Anxiety and you have hydroxyzine tablets to use 1 tablet tid as needed anxiety.  Sometimes antihistamine can increase appetite.  Sedation is common side effect.   Elevated sugar.  Recommended A1c today.  That was declined.  Please avoid excessive processed carbohydrates.   Lower and blood pressure/hypotension.  Maintain adequate hydration.  Follow cardiologist advised on using Lasix half tablet daily as needed.   CHF-clinically stable presently.  Continue Entresto.  Use Lasic prn  Pt states this month had 2 days out of each week with extreme fatigue. Legs feels very heavy. Pt will see Dr. Marin Olp.  He is very frustrated and states has decided not to return to Dr. Marin Olp. He does not want to get ct scan do to cost. Ct would cost him $1000. He states will change his insurance in May. When he changes insurance in May his cost should go down.   "Below notes from oncology at [facility]" Reason for visit/chief complaint: Follow-up  HPI: Brandon Schmitt is a 61 y.o. male seen in follow-up for pancreatic adenocarcinoma. He was last seen in clinic in June following his initial diagnosis of pancreatic adenocarcinoma. He has undergone neoadjuvant therapy with FOLFIRINOX. He has completed 7 cycles. Last cycle, he developed severe respiratory distress requiring ED visit and last cycle has been deferred by medical oncology.  He was last seen in clinic in November where he was planning on surgical excision (robot whipple) in December. This was unfortunately cancelled as the patient requested to not have surgery during the holidays. Since then, he has been experiencing fatigue, edema,  and shortness of breath and is being worked up by a [employer] administrator.   "IMPRESSION:  1. Poorly defined pancreatic head mass and adjacent haziness in the peripancreatic fat appear stable compared to 06/18/2020. No new pancreatic abnormality detected. 2. Significant decrease in volume of bilateral pleural effusions since 07/23/2020."       Review of Systems  Constitutional:  Positive for fatigue and unexpected weight change. Negative for chills, diaphoresis and fever.  HENT:  Negative for congestion and ear pain.   Respiratory:  Negative for cough, chest tightness, shortness of breath and wheezing.   Cardiovascular:  Negative for chest pain and palpitations.  Gastrointestinal:  Negative for abdominal pain, anal bleeding, blood in stool, constipation, diarrhea and nausea.  Genitourinary:  Negative for dysuria and frequency.  Musculoskeletal:  Negative for back pain.     Past Medical History:  Diagnosis Date   Alcohol use 02/13/2020   Blood clotting disorder ([place]) 07/06/2016   Cancer ([place])    Cholelithiasis 02/13/2020   Congestive heart failure (CHF) ([place]) 12/09/2020   Dilated cardiomyopathy ([place]) 12/09/2020   Elevated transaminase level 02/13/2020   Goals of care, counseling/discussion 09/10/2021   Pancreatic adenocarcinoma ([place]) 03/31/2020   Tobacco abuse 02/13/2020   Total bilirubin, elevated 02/13/2020     Social History   Socioeconomic History   Marital status: Single    Spouse name: Not on file   Number of children: 5   Years of education: Not on file   Highest education level: Not on file  Occupational History   Not on file  Tobacco Use   Smoking status: Every Day    Packs/day: 0.00    Years: 25.00    Pack years: 0.00    Types: Cigarettes, Cigars   Smokeless tobacco: Never   Tobacco comments:    2 cigs per day per pt  Substance and Sexual Activity   Alcohol use: Yes    Comment: occ   Drug use: Yes    Types: Marijuana   Sexual activity: Not on file  Other  Topics Concern   Not on file  Social History Narrative   Not on file   Social Determinants of Health   Financial Resource Strain: Not on file  Food Insecurity: Not on file  Transportation Needs: Not on file  Physical Activity: Not on file  Stress: Not on file  Social Connections: Not on file  Intimate Partner Violence: Not on file    Past Surgical History:  Procedure Laterality Date   BILIARY STENT PLACEMENT N/A 02/19/2020   Procedure: BILIARY STENT PLACEMENT;  Surgeon: Arta Silence, MD;  [facility];  Service: Endoscopy;  Laterality: N/A;   ENDOSCOPIC RETROGRADE CHOLANGIOPANCREATOGRAPHY (ERCP) WITH PROPOFOL N/A 02/19/2020   Procedure: ENDOSCOPIC RETROGRADE CHOLANGIOPANCREATOGRAPHY (ERCP) WITH PROPOFOL;  Surgeon: Arta Silence, MD;  [facility];  Service: Endoscopy;  Laterality: N/A;   ENDOSCOPIC RETROGRADE CHOLANGIOPANCREATOGRAPHY (ERCP) WITH PROPOFOL N/A 02/19/2020   Procedure: ENDOSCOPIC RETROGRADE CHOLANGIOPANCREATOGRAPHY (ERCP) WITH PROPOFOL;  Surgeon: Clarene Essex, MD;  [facility];  Service: Endoscopy;  Laterality: N/A;  EUS FIRST THEN ERCP   ESOPHAGOGASTRODUODENOSCOPY (EGD) WITH PROPOFOL N/A 02/19/2020   Procedure: ESOPHAGOGASTRODUODENOSCOPY (EGD) WITH PROPOFOL;  Surgeon: Arta Silence, MD;  [facility];  Service: Endoscopy;  Laterality: N/A;   EUS N/A 02/19/2020   Procedure: UPPER ENDOSCOPIC ULTRASOUND (EUS) LINEAR;  Surgeon: Arta Silence, MD;  [facility];  Service: Endoscopy;  Laterality: N/A;  EUS FIRST THEN ERCP with DR MAGOD   FINE NEEDLE ASPIRATION N/A 02/19/2020   Procedure: FINE NEEDLE ASPIRATION (FNA) LINEAR;  Surgeon: Arta Silence, MD;  [facility];  Service: Endoscopy;  Laterality: N/A;   SPHINCTEROTOMY  02/19/2020   Procedure: SPHINCTEROTOMY;  Surgeon: Arta Silence, MD;  [facility];  Service: Endoscopy;;    Family History  Problem Relation Age of Onset   Heart disease Neg Hx     Diabetes Neg Hx     Allergies  Allergen Reactions   Irinotecan Other (See Comments) and Shortness Of Breath    Tachycardia, hypertension, respiratory distress with productive cough; (onset of symptoms was 30 minutes after oxaliplatin infusion and 30 minutes into the irinotecan infusion) cycle 7 of 8,    Current Outpatient Medications on File Prior to Visit  Medication Sig Dispense Refill   albuterol (VENTOLIN HFA) 108 (90 Base) MCG/ACT inhaler Inhale 1-2 puffs into the lungs every 4 (four) hours as needed for wheezing or shortness of breath. 18 g 1   aspirin EC 81 MG tablet Take 325 mg by mouth daily as needed for mild pain. Swallow whole.     ENTRESTO 24-26 MG TAKE 1 TABLET BY MOUTH TWICE DAILY 60 tablet 1   famotidine (PEPCID) 20 MG tablet Take 1 tablet (20 mg total) by mouth daily. 30 tablet 3   furosemide (LASIX) 40 MG tablet Take 0.5 tablets (20 mg total) by mouth daily as needed. 45 tablet 0   hydrOXYzine (ATARAX) 10 MG tablet Take 1 tablet (10 mg total) by mouth 3 (three) times daily  as needed. 30 tablet 3   hydrOXYzine (ATARAX/VISTARIL) 10 MG tablet Take 1 tablet (10 mg total) by mouth 3 (three) times daily as needed for anxiety. 30 tablet 0   Multiple Vitamins-Minerals (MULTIVITAMIN WITH MINERALS) tablet Take 1 tablet by mouth daily. Unknown strenght     Pancrelipase, Lip-Prot-Amyl, (PANCREAZE) 37000-97300 units CPEP Take 3 capsules by mouth with breakfast, with lunch, and with evening meal. 600 capsule 3   traZODone (DESYREL) 50 MG tablet Take 1 tablet (50 mg total) by mouth at bedtime. 7 tablet 0   traZODone (DESYREL) 50 MG tablet Take 0.5-1 tablets (25-50 mg total) by mouth at bedtime as needed for sleep. 30 tablet 3   No current facility-administered medications on file prior to visit.    BP (!) 100/58    Pulse 78    Resp 18    Ht 6\' 2"  (1.88 m)    Wt 149 lb 12.8 oz (67.9 kg)    SpO2 100%    BMI 19.23 kg/m       Objective:   Physical Exam  General- No acute distress.  Pleasant patient. Neck- Full range of motion, no jvd Lungs- Clear, even and unlabored. Heart- regular rate and rhythm. Neurologic- CNII- XII grossly intact.  Abdomen- soft, nt, nd, BS.  Lower ext- distal pedal edema. 1-2+. Negative homans signs.         Assessment & Plan:  [phone]. Pancreatic cancer by review of chart.  Went through various rounds of treatment in past at [facility].  Dr. Marin Olp oncologist did evaluate you recently  and recommended CT abdomen and pelvis to better assess current status.  However cost was thousand dollars or more per your report.  Understand that his quite expensive for you presently.  You mention that upcoming May you might have more reasonable insurance and get a better price for future studies.  Presently we will try to find radiology service that my office you a cheaper price if you pay cash.  I think this would be beneficial if done.  If successful in providing more reasonable price then I could forward results to Dr. Marin Olp.  Severe fatigue reported.  Also you mention legs feel heavy.  You do have known very low ejection fraction in the past.  Recommend that you follow Dr. Wendy Poet recommended treatment plan.  Presently you declined's chest x-ray, BNP and CMP to assess current CHF status.  You do appear clinically stable presently but if your symptoms do worsen I would recommend that you get those studies done.  I am designating the study is as future.  Let me know if you want the blood work and I could get you scheduled in our lab.  Chest x-ray can be done without being scheduled.  Also for fatigue thought CBC, iron level and B12 would be beneficial to review.  If your signs and symptoms worsen or change please let me know.  Follow-up in 1 month or sooner if needed.  Mackie Pai, PA-C   Time spent with patient today was  45 minutes which consisted of chart revdiew, discussing diagnosis, work up treatment and documentation.

## 2021-11-05 ENCOUNTER — Ambulatory Visit: Payer: Managed Care, Other (non HMO) | Admitting: Cardiology

## 2021-11-24 ENCOUNTER — Telehealth: Payer: Managed Care, Other (non HMO) | Admitting: Medical

## 2021-11-24 ENCOUNTER — Telehealth: Payer: Self-pay | Admitting: Medical

## 2021-11-24 NOTE — Telephone Encounter (Signed)
error 

## 2021-11-25 ENCOUNTER — Telehealth: Payer: Managed Care, Other (non HMO) | Admitting: Medical

## 2021-11-25 ENCOUNTER — Telehealth: Payer: Self-pay | Admitting: Medical

## 2021-11-25 NOTE — Telephone Encounter (Signed)
Pt called back stating that he is playing phone tag with the provider. He stated that he is just trying to get an MRI scheduled. I have informed him that he has missed his VV appointments for the last 2 days.   He stated that he is currently in [facility] and the connection is not good. I have informed him that we are unable to do VV over state lines and phone tag is not working well. It will be best for him to make an in person visit once he gets back.   We have scheduled an appointment for 11/29/21 @ 10am.   FYI to provider.

## 2021-11-25 NOTE — Telephone Encounter (Signed)
See telephone note as called today but no answer.  Mackie Pai, PA-C

## 2021-11-28 ENCOUNTER — Emergency Department (HOSPITAL_COMMUNITY): Payer: Managed Care, Other (non HMO)

## 2021-11-28 ENCOUNTER — Inpatient Hospital Stay (HOSPITAL_COMMUNITY)
Admission: EM | Admit: 2021-11-28 | Discharge: 2021-12-02 | DRG: 871 | Disposition: A | Discharge status: Hospice | Payer: Managed Care, Other (non HMO) | Attending: Family Medicine | Admitting: Family Medicine

## 2021-11-28 DIAGNOSIS — R6521 Severe sepsis with septic shock: Secondary | ICD-10-CM | POA: Diagnosis present

## 2021-11-28 DIAGNOSIS — R739 Hyperglycemia, unspecified: Secondary | ICD-10-CM | POA: Diagnosis not present

## 2021-11-28 DIAGNOSIS — D689 Coagulation defect, unspecified: Secondary | ICD-10-CM | POA: Diagnosis present

## 2021-11-28 DIAGNOSIS — Z86718 Personal history of other venous thrombosis and embolism: Secondary | ICD-10-CM

## 2021-11-28 DIAGNOSIS — G928 Other toxic encephalopathy: Secondary | ICD-10-CM | POA: Diagnosis present

## 2021-11-28 DIAGNOSIS — G9340 Encephalopathy, unspecified: Secondary | ICD-10-CM | POA: Diagnosis present

## 2021-11-28 DIAGNOSIS — T501X5A Adverse effect of loop [high-ceiling] diuretics, initial encounter: Secondary | ICD-10-CM | POA: Diagnosis present

## 2021-11-28 DIAGNOSIS — K56699 Other intestinal obstruction unspecified as to partial versus complete obstruction: Secondary | ICD-10-CM | POA: Diagnosis present

## 2021-11-28 DIAGNOSIS — L041 Acute lymphadenitis of trunk: Secondary | ICD-10-CM | POA: Diagnosis present

## 2021-11-28 DIAGNOSIS — E876 Hypokalemia: Secondary | ICD-10-CM | POA: Diagnosis not present

## 2021-11-28 DIAGNOSIS — E8809 Other disorders of plasma-protein metabolism, not elsewhere classified: Secondary | ICD-10-CM | POA: Diagnosis present

## 2021-11-28 DIAGNOSIS — R54 Age-related physical debility: Secondary | ICD-10-CM | POA: Diagnosis present

## 2021-11-28 DIAGNOSIS — R4182 Altered mental status, unspecified: Secondary | ICD-10-CM | POA: Diagnosis not present

## 2021-11-28 DIAGNOSIS — C787 Secondary malignant neoplasm of liver and intrahepatic bile duct: Secondary | ICD-10-CM | POA: Diagnosis present

## 2021-11-28 DIAGNOSIS — R64 Cachexia: Secondary | ICD-10-CM | POA: Diagnosis present

## 2021-11-28 DIAGNOSIS — E162 Hypoglycemia, unspecified: Secondary | ICD-10-CM | POA: Diagnosis not present

## 2021-11-28 DIAGNOSIS — R7881 Bacteremia: Secondary | ICD-10-CM | POA: Diagnosis not present

## 2021-11-28 DIAGNOSIS — C250 Malignant neoplasm of head of pancreas: Secondary | ICD-10-CM | POA: Diagnosis present

## 2021-11-28 DIAGNOSIS — I214 Non-ST elevation (NSTEMI) myocardial infarction: Secondary | ICD-10-CM | POA: Diagnosis present

## 2021-11-28 DIAGNOSIS — Z66 Do not resuscitate: Secondary | ICD-10-CM | POA: Diagnosis present

## 2021-11-28 DIAGNOSIS — E873 Alkalosis: Secondary | ICD-10-CM | POA: Diagnosis present

## 2021-11-28 DIAGNOSIS — B961 Klebsiella pneumoniae [K. pneumoniae] as the cause of diseases classified elsewhere: Secondary | ICD-10-CM | POA: Diagnosis not present

## 2021-11-28 DIAGNOSIS — I871 Compression of vein: Secondary | ICD-10-CM | POA: Diagnosis present

## 2021-11-28 DIAGNOSIS — Z20822 Contact with and (suspected) exposure to covid-19: Secondary | ICD-10-CM | POA: Diagnosis present

## 2021-11-28 DIAGNOSIS — L89152 Pressure ulcer of sacral region, stage 2: Secondary | ICD-10-CM | POA: Diagnosis present

## 2021-11-28 DIAGNOSIS — J9601 Acute respiratory failure with hypoxia: Secondary | ICD-10-CM | POA: Diagnosis present

## 2021-11-28 DIAGNOSIS — A4159 Other Gram-negative sepsis: Secondary | ICD-10-CM | POA: Diagnosis present

## 2021-11-28 DIAGNOSIS — I420 Dilated cardiomyopathy: Secondary | ICD-10-CM | POA: Diagnosis present

## 2021-11-28 DIAGNOSIS — C259 Malignant neoplasm of pancreas, unspecified: Secondary | ICD-10-CM

## 2021-11-28 DIAGNOSIS — E43 Unspecified severe protein-calorie malnutrition: Secondary | ICD-10-CM | POA: Diagnosis present

## 2021-11-28 DIAGNOSIS — D638 Anemia in other chronic diseases classified elsewhere: Secondary | ICD-10-CM | POA: Diagnosis present

## 2021-11-28 DIAGNOSIS — I5023 Acute on chronic systolic (congestive) heart failure: Secondary | ICD-10-CM | POA: Diagnosis present

## 2021-11-28 DIAGNOSIS — Z79899 Other long term (current) drug therapy: Secondary | ICD-10-CM

## 2021-11-28 DIAGNOSIS — Z515 Encounter for palliative care: Secondary | ICD-10-CM

## 2021-11-28 DIAGNOSIS — F17210 Nicotine dependence, cigarettes, uncomplicated: Secondary | ICD-10-CM | POA: Diagnosis present

## 2021-11-28 DIAGNOSIS — I959 Hypotension, unspecified: Secondary | ICD-10-CM

## 2021-11-28 DIAGNOSIS — A419 Sepsis, unspecified organism: Secondary | ICD-10-CM | POA: Diagnosis not present

## 2021-11-28 DIAGNOSIS — Z681 Body mass index (BMI) 19 or less, adult: Secondary | ICD-10-CM

## 2021-11-28 DIAGNOSIS — Z888 Allergy status to other drugs, medicaments and biological substances status: Secondary | ICD-10-CM

## 2021-11-28 DIAGNOSIS — Z7982 Long term (current) use of aspirin: Secondary | ICD-10-CM

## 2021-11-28 DIAGNOSIS — Z7189 Other specified counseling: Secondary | ICD-10-CM | POA: Diagnosis not present

## 2021-11-28 DIAGNOSIS — L8990 Pressure ulcer of unspecified site, unspecified stage: Secondary | ICD-10-CM | POA: Insufficient documentation

## 2021-11-28 LAB — I-STAT ARTERIAL BLOOD GAS, ED
Acid-Base Excess: 30 mmol/L — ABNORMAL HIGH (ref 0.0–2.0)
Bicarbonate: 51.7 mmol/L — ABNORMAL HIGH (ref 20.0–28.0)
Calcium, Ion: 0.94 mmol/L — ABNORMAL LOW (ref 1.15–1.40)
HCT: 32 % — ABNORMAL LOW (ref 39.0–52.0)
Hemoglobin: 10.9 g/dL — ABNORMAL LOW (ref 13.0–17.0)
O2 Saturation: 100 %
Patient temperature: 93.6
Potassium: 2.8 mmol/L — ABNORMAL LOW (ref 3.5–5.1)
Sodium: 142 mmol/L (ref 135–145)
TCO2: >50 mmol/L — ABNORMAL HIGH (ref 22–32)
pCO2 arterial: 32.7 mmHg (ref 32–48)
pH, Arterial: 7.801 (ref 7.35–7.45)
pO2, Arterial: 516 mmHg — ABNORMAL HIGH (ref 83–108)

## 2021-11-28 LAB — URINALYSIS, ROUTINE W REFLEX MICROSCOPIC
Bacteria, UA: NONE SEEN
Bilirubin Urine: NEGATIVE
Glucose, UA: 50 mg/dL — AB
Ketones, ur: NEGATIVE mg/dL
Leukocytes,Ua: NEGATIVE
Nitrite: NEGATIVE
Protein, ur: NEGATIVE mg/dL
Specific Gravity, Urine: 1.016 (ref 1.005–1.030)
pH: 6 (ref 5.0–8.0)

## 2021-11-28 LAB — CBC WITH DIFFERENTIAL/PLATELET
Abs Immature Granulocytes: 0.03 10*3/uL (ref 0.00–0.07)
Basophils Absolute: 0 10*3/uL (ref 0.0–0.1)
Basophils Relative: 0 %
Eosinophils Absolute: 0 10*3/uL (ref 0.0–0.5)
Eosinophils Relative: 0 %
HCT: 29.2 % — ABNORMAL LOW (ref 39.0–52.0)
Hemoglobin: 9.7 g/dL — ABNORMAL LOW (ref 13.0–17.0)
Immature Granulocytes: 0 %
Lymphocytes Relative: 8 %
Lymphs Abs: 0.6 10*3/uL — ABNORMAL LOW (ref 0.7–4.0)
MCH: 29.2 pg (ref 26.0–34.0)
MCHC: 33.2 g/dL (ref 30.0–36.0)
MCV: 88 fL (ref 80.0–100.0)
Monocytes Absolute: 0.2 10*3/uL (ref 0.1–1.0)
Monocytes Relative: 3 %
Neutro Abs: 6.5 10*3/uL (ref 1.7–7.7)
Neutrophils Relative %: 89 %
Platelets: 85 10*3/uL — ABNORMAL LOW (ref 150–400)
RBC: 3.32 MIL/uL — ABNORMAL LOW (ref 4.22–5.81)
RDW: 15.2 % (ref 11.5–15.5)
WBC: 7.3 10*3/uL (ref 4.0–10.5)
nRBC: 0 % (ref 0.0–0.2)

## 2021-11-28 LAB — COMPREHENSIVE METABOLIC PANEL
ALT: 92 U/L — ABNORMAL HIGH (ref 0–44)
AST: 125 U/L — ABNORMAL HIGH (ref 15–41)
Albumin: 2.1 g/dL — ABNORMAL LOW (ref 3.5–5.0)
Alkaline Phosphatase: 109 U/L (ref 38–126)
Anion gap: 8 (ref 5–15)
BUN: 53 mg/dL — ABNORMAL HIGH (ref 6–20)
CO2: 38 mmol/L — ABNORMAL HIGH (ref 22–32)
Calcium: 6.9 mg/dL — ABNORMAL LOW (ref 8.9–10.3)
Chloride: 97 mmol/L — ABNORMAL LOW (ref 98–111)
Creatinine, Ser: 0.56 mg/dL — ABNORMAL LOW (ref 0.61–1.24)
GFR, Estimated: >60 mL/min (ref >60)
Glucose, Bld: <20 mg/dL — CL (ref 70–99)
Potassium: 2.9 mmol/L — ABNORMAL LOW (ref 3.5–5.1)
Sodium: 143 mmol/L (ref 135–145)
Total Bilirubin: 2.4 mg/dL — ABNORMAL HIGH (ref 0.3–1.2)
Total Protein: 4.4 g/dL — ABNORMAL LOW (ref 6.5–8.1)

## 2021-11-28 LAB — RAPID URINE DRUG SCREEN, HOSP PERFORMED
Amphetamines: NOT DETECTED
Barbiturates: NOT DETECTED
Benzodiazepines: NOT DETECTED
Cocaine: NOT DETECTED
Opiates: NOT DETECTED
Tetrahydrocannabinol: POSITIVE — AB

## 2021-11-28 LAB — TROPONIN I (HIGH SENSITIVITY): Troponin I (High Sensitivity): 169 ng/L (ref <18)

## 2021-11-28 LAB — CBG MONITORING, ED
Glucose-Capillary: <10 mg/dL — CL (ref 70–99)
Glucose-Capillary: 117 mg/dL — ABNORMAL HIGH (ref 70–99)

## 2021-11-28 LAB — LACTIC ACID, PLASMA: Lactic Acid, Venous: 0.5 mmol/L (ref 0.5–1.9)

## 2021-11-28 LAB — I-STAT CHEM 8, ED
BUN: 48 mg/dL — ABNORMAL HIGH (ref 6–20)
Calcium, Ion: 0.89 mmol/L — CL (ref 1.15–1.40)
Chloride: 93 mmol/L — ABNORMAL LOW (ref 98–111)
Creatinine, Ser: 0.7 mg/dL (ref 0.61–1.24)
Glucose, Bld: <20 mg/dL — CL (ref 70–99)
HCT: 29 % — ABNORMAL LOW (ref 39.0–52.0)
Hemoglobin: 9.9 g/dL — ABNORMAL LOW (ref 13.0–17.0)
Potassium: 2.8 mmol/L — ABNORMAL LOW (ref 3.5–5.1)
Sodium: 144 mmol/L (ref 135–145)
TCO2: 40 mmol/L — ABNORMAL HIGH (ref 22–32)

## 2021-11-28 LAB — ETHANOL: Alcohol, Ethyl (B): <10 mg/dL (ref <10)

## 2021-11-28 MED ORDER — POTASSIUM CHLORIDE 10 MEQ/100ML IV SOLN
10.0000 meq | INTRAVENOUS | Status: AC
  Administered 2021-11-28 – 2021-11-29 (×6): 10 meq via INTRAVENOUS
  Filled 2021-11-28 (×6): qty 100

## 2021-11-28 MED ORDER — SODIUM CHLORIDE 0.9 % IV SOLN
2.0000 g | INTRAVENOUS | Status: DC
  Administered 2021-11-29 – 2021-12-01 (×3): 2 g via INTRAVENOUS
  Filled 2021-11-28 (×3): qty 20

## 2021-11-28 MED ORDER — PHENYLEPHRINE 40 MCG/ML (10ML) SYRINGE FOR IV PUSH (FOR BLOOD PRESSURE SUPPORT)
PREFILLED_SYRINGE | INTRAVENOUS | Status: AC
  Filled 2021-11-28: qty 10

## 2021-11-28 MED ORDER — VANCOMYCIN HCL IN DEXTROSE 1-5 GM/200ML-% IV SOLN
1000.0000 mg | Freq: Two times a day (BID) | INTRAVENOUS | Status: DC
  Filled 2021-11-28: qty 200

## 2021-11-28 MED ORDER — DOCUSATE SODIUM 100 MG PO CAPS: 100.0000 mg | ORAL_CAPSULE | Freq: Two times a day (BID) | ORAL | Status: DC | PRN

## 2021-11-28 MED ORDER — FENTANYL BOLUS VIA INFUSION
50.0000 ug | INTRAVENOUS | Status: DC | PRN
  Administered 2021-11-28: 50 ug via INTRAVENOUS
  Administered 2021-11-29: 100 ug via INTRAVENOUS
  Administered 2021-11-29 – 2021-11-30 (×2): 50 ug via INTRAVENOUS
  Filled 2021-11-28: qty 100

## 2021-11-28 MED ORDER — NOREPINEPHRINE 4 MG/250ML-% IV SOLN
INTRAVENOUS | Status: DC | PRN
  Administered 2021-11-28: 2 ug/min via INTRAVENOUS

## 2021-11-28 MED ORDER — METRONIDAZOLE 500 MG/100ML IV SOLN: 500.0000 mg | Freq: Two times a day (BID) | INTRAVENOUS | Status: DC

## 2021-11-28 MED ORDER — PHENYLEPHRINE HCL (PRESSORS) 10 MG/ML IV SOLN
INTRAVENOUS | Status: DC | PRN
Start: 2021-11-28 — End: 2021-11-30
  Administered 2021-11-28: 80 ug

## 2021-11-28 MED ORDER — FENTANYL BOLUS VIA INFUSION
50.0000 ug | INTRAVENOUS | Status: DC | PRN
  Administered 2021-11-30 (×3): 50 ug via INTRAVENOUS
  Filled 2021-11-28: qty 50

## 2021-11-28 MED ORDER — KCL IN DEXTROSE-NACL 20-5-0.9 MEQ/L-%-% IV SOLN
INTRAVENOUS | Status: DC
Start: 2021-11-29 — End: 2021-11-29
  Filled 2021-11-28: qty 1000

## 2021-11-28 MED ORDER — SODIUM CHLORIDE 0.9 % IV SOLN: 2.0000 g | Freq: Three times a day (TID) | INTRAVENOUS | Status: DC

## 2021-11-28 MED ORDER — ROCURONIUM BROMIDE 10 MG/ML (PF) SYRINGE
PREFILLED_SYRINGE | INTRAVENOUS | Status: AC
Start: 2021-11-28 — End: 2021-11-29
  Filled 2021-11-28: qty 10

## 2021-11-28 MED ORDER — FENTANYL CITRATE PF 50 MCG/ML IJ SOSY
50.0000 ug | PREFILLED_SYRINGE | Freq: Once | INTRAMUSCULAR | Status: AC
  Administered 2021-11-29: 50 ug via INTRAVENOUS
  Filled 2021-11-28: qty 1

## 2021-11-28 MED ORDER — DEXTROSE 50 % IV SOLN
1.0000 | Freq: Once | INTRAVENOUS | Status: AC
  Administered 2021-11-28: 50 mL via INTRAVENOUS

## 2021-11-28 MED ORDER — VANCOMYCIN HCL 1500 MG/300ML IV SOLN
1500.0000 mg | Freq: Once | INTRAVENOUS | Status: AC
  Administered 2021-11-29: 1500 mg via INTRAVENOUS
  Filled 2021-11-28: qty 300

## 2021-11-28 MED ORDER — SODIUM CHLORIDE 0.9 % IV SOLN
500.0000 mg | INTRAVENOUS | Status: DC
  Administered 2021-11-29 – 2021-11-30 (×2): 500 mg via INTRAVENOUS
  Filled 2021-11-28 (×2): qty 5

## 2021-11-28 MED ORDER — ETOMIDATE 2 MG/ML IV SOLN
INTRAVENOUS | Status: DC | PRN
  Administered 2021-11-28: 10 mg via INTRAVENOUS

## 2021-11-28 MED ORDER — FENTANYL CITRATE PF 50 MCG/ML IJ SOSY
50.0000 ug | PREFILLED_SYRINGE | Freq: Once | INTRAMUSCULAR | Status: AC
  Administered 2021-11-28: 50 ug via INTRAVENOUS
  Filled 2021-11-28: qty 1

## 2021-11-28 MED ORDER — ETOMIDATE 2 MG/ML IV SOLN
INTRAVENOUS | Status: AC
Start: 2021-11-28 — End: 2021-11-29
  Filled 2021-11-28: qty 10

## 2021-11-28 MED ORDER — NOREPINEPHRINE 4 MG/250ML-% IV SOLN
0.0000 ug/min | INTRAVENOUS | Status: DC
  Administered 2021-11-28: 2 ug/min via INTRAVENOUS

## 2021-11-28 MED ORDER — MAGNESIUM SULFATE 2 GM/50ML IV SOLN
2.0000 g | Freq: Once | INTRAVENOUS | Status: AC
Start: 2021-11-28 — End: 2021-11-29
  Administered 2021-11-28: 2 g via INTRAVENOUS
  Filled 2021-11-28: qty 50

## 2021-11-28 MED ORDER — POLYETHYLENE GLYCOL 3350 17 G PO PACK: 17.0000 g | PACK | Freq: Every day | ORAL | Status: DC | PRN

## 2021-11-28 MED ORDER — ENOXAPARIN SODIUM 40 MG/0.4ML IJ SOSY
40.0000 mg | PREFILLED_SYRINGE | Freq: Every day | INTRAMUSCULAR | Status: DC
  Administered 2021-11-29 – 2021-12-01 (×3): 40 mg via SUBCUTANEOUS
  Filled 2021-11-28 (×3): qty 0.4

## 2021-11-28 MED ORDER — NOREPINEPHRINE 4 MG/250ML-% IV SOLN
INTRAVENOUS | Status: AC
Start: 2021-11-28 — End: 2021-11-29
  Filled 2021-11-28: qty 250

## 2021-11-28 MED ORDER — FENTANYL 2500MCG IN NS 250ML (10MCG/ML) PREMIX INFUSION
50.0000 ug/h | INTRAVENOUS | Status: DC
  Administered 2021-11-28: 50 ug/h via INTRAVENOUS
  Administered 2021-11-29: 100 ug/h via INTRAVENOUS
  Filled 2021-11-28: qty 250

## 2021-11-28 MED ORDER — FENTANYL 2500MCG IN NS 250ML (10MCG/ML) PREMIX INFUSION
25.0000 ug/h | INTRAVENOUS | Status: DC
  Administered 2021-11-28: 50 ug/h via INTRAVENOUS
  Filled 2021-11-28: qty 250

## 2021-11-28 MED ORDER — ROCURONIUM BROMIDE 50 MG/5ML IV SOLN
INTRAVENOUS | Status: DC | PRN
  Administered 2021-11-28: 80 mg via INTRAVENOUS

## 2021-11-28 NOTE — ED Triage Notes (Signed)
Pt BIB [facility] as a code stroke for a right sided lean, non- purposeful movement and nonverbal. CBG 123 with EMS and BP 92/56.   Pt has a Hx of pancreatic cancer, jaundice noticed on arrival .

## 2021-11-28 NOTE — Progress Notes (Deleted)
PHARMACIST CODE STROKE RESPONSE  Notified to mix TNK at 2140 by Dr. Rory Percy Delivered TNK to RN at 2143  TNK dose = 19 mg IV over 5 seconds  Issues/delays encountered (if applicable): Patient initially refused treatment and required discussion with family.   Devaris Quirk, Rande Lawman 11/28/21 9:45 PM

## 2021-11-28 NOTE — Progress Notes (Signed)
Pharmacy Antibiotic Note  Brandon Schmitt is a 61 y.o. male admitted on 11/28/2021 with  weakness, hypotension, and shock .  Pharmacy has been consulted for Vancomycin dosing.  Pt is also ordered Cefepime and Flagyl.  Plan: Vancomycin 1500mg  IV x 1, followed by Vancomycin 1gm IV q12h Calculated AUC 487.6 (goal 400-550) Using SCr 0.8, Vd 0.72  Height: 6\' 2"  (188 cm) Weight: 68 kg (150 lb) IBW/kg (Calculated) : 82.2  Temp (24hrs), Avg:92.6 F (33.7 C), Min:91.6 F (33.1 C), Max:93 F (33.9 C)  Recent Labs  Lab 11/28/21 2218 11/28/21 2227  WBC 7.3  --   CREATININE 0.56* 0.70  LATICACIDVEN 0.5  --     Estimated Creatinine Clearance: 95.6 mL/min (by C-G formula based on SCr of 0.7 mg/dL).    Allergies  Allergen Reactions   Irinotecan Other (See Comments) and Shortness Of Breath    Tachycardia, hypertension, respiratory distress with productive cough; (onset of symptoms was 30 minutes after oxaliplatin infusion and 30 minutes into the irinotecan infusion) cycle 7 of 8,    Antimicrobials this admission: Vanc 2/27 >> Cefepime 2/27 >> Flagyl 2/27 >>   Dose adjustments this admission:   Microbiology results: 2/26 BCx: sent 2/26 UCx: sent    Thank you for allowing pharmacy to be a part of this patients care.  [employer], Pharm.D., BCPS Clinical Pharmacist  **Pharmacist phone directory can be found on [email or web address] listed under [place].  11/28/2021 11:48 PM

## 2021-11-28 NOTE — ED Provider Notes (Signed)
[facility] EMERGENCY DEPARTMENT Provider Note   CSN: [id number] Arrival date & time: 11/28/21  2141     History  Chief Complaint  Patient presents with   Altered Mental Status    Brandon Schmitt is a 61 y.o. male.  He has a history of pancreatic cancer and cardiomyopathy.  EMS was called as patient was less responsive between 2 and 4 at this afternoon.  They found him to be weak and unable to stand.  Possible gaze preference.  He was a stroke activation in the field.  Patient arrives unable to give any history level 5 caveat.  He is in a near arrest state on presentation and is on a nonrebreather.  Pulses thready.  EMS states blood sugar was normal.   Altered Mental Status Presenting symptoms: unresponsiveness   Severity:  Severe Most recent episode:  Today Episode history:  Continuous Progression:  Unchanged Chronicity:  New     Home Medications Prior to Admission medications   Medication Sig Start Date End Date Taking? Authorizing Provider  albuterol (VENTOLIN HFA) 108 (90 Base) MCG/ACT inhaler Inhale 1-2 puffs into the lungs every 4 (four) hours as needed for wheezing or shortness of breath. 08/09/21   Park Liter, MD  aspirin EC 81 MG tablet Take 325 mg by mouth daily as needed for mild pain. Swallow whole.    [provider]  ENTRESTO 24-26 MG TAKE 1 TABLET BY MOUTH TWICE DAILY 10/19/21   Park Liter, MD  famotidine (PEPCID) 20 MG tablet Take 1 tablet (20 mg total) by mouth daily. 09/30/21   Saguier, Percell Miller, PA-C  furosemide (LASIX) 40 MG tablet Take 0.5 tablets (20 mg total) by mouth daily as needed. 10/15/21 01/13/22  Park Liter, MD  hydrOXYzine (ATARAX) 10 MG tablet Take 1 tablet (10 mg total) by mouth 3 (three) times daily as needed. 09/30/21   Saguier, Percell Miller, PA-C  hydrOXYzine (ATARAX/VISTARIL) 10 MG tablet Take 1 tablet (10 mg total) by mouth 3 (three) times daily as needed for anxiety. 07/13/21   Hosie Poisson, MD   Multiple Vitamins-Minerals (MULTIVITAMIN WITH MINERALS) tablet Take 1 tablet by mouth daily. Unknown strenght    [provider]  Pancrelipase, Lip-Prot-Amyl, (PANCREAZE) [id number] units CPEP Take 3 capsules by mouth with breakfast, with lunch, and with evening meal. 07/13/21   Hosie Poisson, MD  traZODone (DESYREL) 50 MG tablet Take 1 tablet (50 mg total) by mouth at bedtime. 07/13/21   Hosie Poisson, MD  traZODone (DESYREL) 50 MG tablet Take 0.5-1 tablets (25-50 mg total) by mouth at bedtime as needed for sleep. 09/30/21   Saguier, Percell Miller, PA-C      Allergies    Irinotecan    Review of Systems   Review of Systems  Unable to perform ROS: Patient unresponsive   Physical Exam Updated Vital Signs BP (!) 80/63    Pulse (!) 59    Resp 10    SpO2 92%  Physical Exam Constitutional:      Appearance: He is cachectic. He is ill-appearing.  HENT:     Head: Normocephalic and atraumatic.  Cardiovascular:     Rate and Rhythm: Regular rhythm. Bradycardia present.     Heart sounds: No murmur heard. Pulmonary:     Comments: Minimal spontaneous breath sounds.  Diminished on exam. Abdominal:     Palpations: There is no mass.     Tenderness: There is no guarding.  Musculoskeletal:        General: Normal range  of motion.     Right lower leg: Edema present.     Left lower leg: Edema present.  Skin:    Comments: Cool and dusky  Neurological:     Comments: Patient has no spontaneous neurologic exam.  Eyes are open no blink to confrontation.  Not withdrawing to pain.  Has minimal spontaneous respiratory effort.    ED Results / Procedures / Treatments   Labs (all labs ordered are listed, but only abnormal results are displayed) Labs Reviewed  COMPREHENSIVE METABOLIC PANEL - Abnormal; Notable for the following components:      Result Value   Potassium 2.9 (*)    Chloride 97 (*)    CO2 38 (*)    Glucose, Bld <20 (*)    BUN 53 (*)    Creatinine, Ser 0.56 (*)    Calcium 6.9 (*)     Total Protein 4.4 (*)    Albumin 2.1 (*)    AST 125 (*)    ALT 92 (*)    Total Bilirubin 2.4 (*)    All other components within normal limits  RAPID URINE DRUG SCREEN, HOSP PERFORMED - Abnormal; Notable for the following components:   Tetrahydrocannabinol POSITIVE (*)    All other components within normal limits  URINALYSIS, ROUTINE W REFLEX MICROSCOPIC - Abnormal; Notable for the following components:   Glucose, UA 50 (*)    Hgb urine dipstick SMALL (*)    All other components within normal limits  CBC WITH DIFFERENTIAL/PLATELET - Abnormal; Notable for the following components:   RBC 3.32 (*)    Hemoglobin 9.7 (*)    HCT 29.2 (*)    Platelets 85 (*)    Lymphs Abs 0.6 (*)    All other components within normal limits  PROTIME-INR - Abnormal; Notable for the following components:   Prothrombin Time 27.5 (*)    INR 2.6 (*)    All other components within normal limits  CBC - Abnormal; Notable for the following components:   RBC 3.09 (*)    Hemoglobin 9.0 (*)    HCT 27.6 (*)    Platelets 78 (*)    All other components within normal limits  SALICYLATE LEVEL - Abnormal; Notable for the following components:   Salicylate Lvl <4.0 (*)    All other components within normal limits  ACETAMINOPHEN LEVEL - Abnormal; Notable for the following components:   Acetaminophen (Tylenol), Serum <10 (*)    All other components within normal limits  GLUCOSE, CAPILLARY - Abnormal; Notable for the following components:   Glucose-Capillary 115 (*)    All other components within normal limits  AMMONIA - Abnormal; Notable for the following components:   Ammonia 63 (*)    All other components within normal limits  GLUCOSE, CAPILLARY - Abnormal; Notable for the following components:   Glucose-Capillary 111 (*)    All other components within normal limits  I-STAT CHEM 8, ED - Abnormal; Notable for the following components:   Potassium 2.8 (*)    Chloride 93 (*)    BUN 48 (*)    Glucose, Bld <20 (*)     Calcium, Ion 0.89 (*)    TCO2 40 (*)    Hemoglobin 9.9 (*)    HCT 29.0 (*)    All other components within normal limits  CBG MONITORING, ED - Abnormal; Notable for the following components:   Glucose-Capillary <10 (*)    All other components within normal limits  CBG MONITORING, ED - Abnormal; Notable for  the following components:   Glucose-Capillary 117 (*)    All other components within normal limits  I-STAT ARTERIAL BLOOD GAS, ED - Abnormal; Notable for the following components:   pH, Arterial 7.801 (*)    pO2, Arterial 516 (*)    Bicarbonate 51.7 (*)    TCO2 >50 (*)    Acid-Base Excess 30.0 (*)    Potassium 2.8 (*)    Calcium, Ion 0.94 (*)    HCT 32.0 (*)    Hemoglobin 10.9 (*)    All other components within normal limits  CBG MONITORING, ED - Abnormal; Notable for the following components:   Glucose-Capillary 50 (*)    All other components within normal limits  I-STAT ARTERIAL BLOOD GAS, ED - Abnormal; Notable for the following components:   pH, Arterial 7.761 (*)    pO2, Arterial 255 (*)    Bicarbonate 50.7 (*)    TCO2 >50 (*)    Acid-Base Excess 28.0 (*)    Potassium 2.6 (*)    Calcium, Ion 0.92 (*)    HCT 27.0 (*)    Hemoglobin 9.2 (*)    All other components within normal limits  POCT I-STAT 7, (LYTES, BLD GAS, ICA,H+H) - Abnormal; Notable for the following components:   pH, Arterial 7.457 (*)    pCO2 arterial 61.7 (*)    Bicarbonate 43.6 (*)    TCO2 45 (*)    Acid-Base Excess 17.0 (*)    Potassium 3.0 (*)    Calcium, Ion 0.98 (*)    HCT 27.0 (*)    Hemoglobin 9.2 (*)    All other components within normal limits  TROPONIN I (HIGH SENSITIVITY) - Abnormal; Notable for the following components:   Troponin I (High Sensitivity) 169 (*)    All other components within normal limits  TROPONIN I (HIGH SENSITIVITY) - Abnormal; Notable for the following components:   Troponin I (High Sensitivity) 192 (*)    All other components within normal limits  RESP PANEL BY  RT-PCR (FLU A&B, COVID) ARPGX2  CULTURE, BLOOD (ROUTINE X 2)  URINE CULTURE  MRSA NEXT GEN BY PCR, NASAL  CULTURE, BLOOD (ROUTINE X 2)  ETHANOL  LACTIC ACID, PLASMA  LACTIC ACID, PLASMA  APTT  CREATININE, SERUM  GLUCOSE, CAPILLARY  C-PEPTIDE  BLOOD GAS, ARTERIAL  BLOOD GAS, ARTERIAL    EKG EKG Interpretation  Date/Time:  Sunday November 28 2021 21:54:14 EST Ventricular Rate:  52 PR Interval:    QRS Duration: 136 QT Interval:  545 QTC Calculation: 507 R Axis:   -37 Text Interpretation: probable sinus Ventricular premature complex Left bundle branch block low voltage new from prior 10/22 Confirmed by Aletta Edouard [phone]) on 11/28/2021 10:10:22 PM  Radiology CT HEAD WO CONTRAST (5MM)  Result Date: 11/29/2021 CLINICAL DATA:  Normal GB deficit. EXAM: CT HEAD WITHOUT CONTRAST TECHNIQUE: Contiguous axial images were obtained from the base of the skull through the vertex without intravenous contrast. RADIATION DOSE REDUCTION: This exam was performed according to the departmental dose-optimization program which includes automated exposure control, adjustment of the mA and/or kV according to patient size and/or use of iterative reconstruction technique. COMPARISON:  None. FINDINGS: Brain: The ventricles and sulci appropriate size for patient's age. Mild periventricular and deep white matter chronic microvascular ischemic changes noted. There is no acute intracranial hemorrhage. No mass effect or midline shift no extra-axial fluid collection. Vascular: No hyperdense vessel or unexpected calcification. Skull: Normal. Negative for fracture or focal lesion. Sinuses/Orbits: No acute finding. Other: Partially  visualized enteric tube in the oropharynx. IMPRESSION: No acute intracranial pathology. Electronically Signed   By: Anner Crete M.D.   On: 11/29/2021 00:43   CT Angio Chest PE W/Cm &/Or Wo Cm  Result Date: 11/29/2021 CLINICAL DATA:  Pulmonary embolism (PE) suspected, high prob;  Abdominal pain, acute, nonlocalized. Generalized weakness, hypotension, shock. EXAM: CT ANGIOGRAPHY CHEST CT ABDOMEN AND PELVIS WITH CONTRAST TECHNIQUE: Multidetector CT imaging of the chest was performed using the standard protocol during bolus administration of intravenous contrast. Multiplanar CT image reconstructions and MIPs were obtained to evaluate the vascular anatomy. Multidetector CT imaging of the abdomen and pelvis was performed using the standard protocol during bolus administration of intravenous contrast. RADIATION DOSE REDUCTION: This exam was performed according to the departmental dose-optimization program which includes automated exposure control, adjustment of the mA and/or kV according to patient size and/or use of iterative reconstruction technique. CONTRAST:  22m OMNIPAQUE IOHEXOL 350 MG/ML SOLN COMPARISON:  CT abdomen 09/29/2020 FINDINGS: CTA CHEST FINDINGS Cardiovascular: There is adequate opacification of the pulmonary arterial tree. No intraluminal filling defect identified to suggest acute pulmonary embolism. The central pulmonary arteries are of normal caliber. Moderate coronary artery calcification. Global cardiac size is within normal limits though left ventricular dilation is noted, progressive since prior examination. No pericardial effusion. The thoracic aorta is of normal caliber. Mild atherosclerotic calcification noted within the aortic arch. Right internal jugular chest port tip noted within the superior vena cava. Mediastinum/Nodes: Endotracheal tube seen 4 cm above the carina. Nasogastric tube extends into the proximal body of the stomach. There is marked body wall wasting and diffuse subcutaneous and mediastinal edema which impairs delineation of the mediastinal structures. Visualized thyroid is unremarkable. Lungs/Pleura: There is interlobular septal thickening, best appreciated within the lung apices, with superimposed ground-glass pulmonary infiltrate most suggestive of  interstitial and alveolar pulmonary edema. Upper lobe predominance may relate to supine positioning and pulmonary vascular redistribution. Layering debris noted within the central airways. Mild bibasilar dependent atelectasis. No pneumothorax or pleural effusion. Musculoskeletal: No acute bone abnormality. No lytic or blastic bone lesion. Review of the MIP images confirms the above findings. CT ABDOMEN and PELVIS FINDINGS Hepatobiliary: Innumerable hepatic metastases are seen throughout both hepatic lobes in keeping with the patient's known metastatic pancreatic cancer. Mild pneumobilia is present. Palliative stenting of the extrahepatic bile duct has been performed with a a metallic biliary stent noted extending into the second portion of the duodenum. Gallbladder is unremarkable. 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 There is atrophy of the pancreatic body and tail again noted. Spleen: Unremarkable Adrenals/Urinary Tract: Adrenal glands are unremarkable. Kidneys are normal, without renal calculi, focal lesion, or hydronephrosis. Bladder is decompressed with a Foley catheter balloon seen within its lumen. Stomach/Bowel: The stomach is mildly distended and fluid-filled. The pancreatic mass abuts an narrows the first portion of the duodenum, best seen on image # 31/5, possibly resulting in some degree of gastric outlet obstruction. The stomach, small bowel, and large bowel are otherwise unremarkable. Mild ascites has developed. Vascular/Lymphatic: Mild aortoiliac atherosclerotic calcification. No aortic aneurysm. Multiple necrotic retroperitoneal lymph nodes have developed, in keeping with developing nodal metastases within the a perirenal region Reproductive: Prostate is unremarkable. Other: There is marked diffuse body wall wasting and  subcutaneous edema noted. There is extensive retroperitoneal edema which limits delineation of the retroperitoneal structures. Musculoskeletal: No acute bone  abnormality. No lytic or blastic bone lesion. Review of the MIP images confirms the above findings. 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 Marked body wall wasting with severe anasarca. No pulmonary embolism. Interval development of left ventricular dilation. Diffuse interstitial and ground-glass alveolar pulmonary infiltrate most in keeping with interstitial and alveolar pulmonary edema, possibly cardiogenic in nature. Electronically Signed   By: Fidela Salisbury M.D.   On: 11/29/2021 01:04   CT Abdomen Pelvis W Contrast  Result Date: 11/29/2021 CLINICAL DATA:  Pulmonary embolism (PE) suspected, high prob; Abdominal pain, acute, nonlocalized. Generalized weakness, hypotension, shock. EXAM: CT ANGIOGRAPHY CHEST CT ABDOMEN AND PELVIS WITH CONTRAST TECHNIQUE: Multidetector CT imaging of the chest was performed using the standard protocol during bolus administration of intravenous contrast. Multiplanar CT image reconstructions and MIPs were obtained to evaluate the vascular anatomy. Multidetector CT imaging of the abdomen and pelvis was performed using the standard protocol during bolus administration of intravenous contrast. RADIATION DOSE REDUCTION: This exam was performed according to the departmental dose-optimization program which includes automated exposure control, adjustment of the mA and/or kV according to patient size and/or use of iterative reconstruction technique. CONTRAST:  55m OMNIPAQUE IOHEXOL 350 MG/ML SOLN COMPARISON:   CT abdomen 09/29/2020 FINDINGS: CTA CHEST FINDINGS Cardiovascular: There is adequate opacification of the pulmonary arterial tree. No intraluminal filling defect identified to suggest acute pulmonary embolism. The central pulmonary arteries are of normal caliber. Moderate coronary artery calcification. Global cardiac size is within normal limits though left ventricular dilation is noted, progressive since prior examination. No pericardial effusion. The thoracic aorta is of normal caliber. Mild atherosclerotic calcification noted within the aortic arch. Right internal jugular chest port tip noted within the superior vena cava. Mediastinum/Nodes: Endotracheal tube seen 4 cm above the carina. Nasogastric tube extends into the proximal body of the stomach. There is marked body wall wasting and diffuse subcutaneous and mediastinal edema which impairs delineation of the mediastinal structures. Visualized thyroid is unremarkable. Lungs/Pleura: There is interlobular septal thickening, best appreciated within the lung apices, with superimposed ground-glass pulmonary infiltrate most suggestive of interstitial and alveolar pulmonary edema. Upper lobe predominance may relate to supine positioning and pulmonary vascular redistribution. Layering debris noted within the central airways. Mild bibasilar dependent atelectasis. No pneumothorax or pleural effusion. Musculoskeletal: No acute bone abnormality. No lytic or blastic bone lesion. Review of the MIP images confirms the above findings. CT ABDOMEN and PELVIS FINDINGS Hepatobiliary: Innumerable hepatic metastases are seen throughout both hepatic lobes in keeping with the patient's known metastatic pancreatic cancer. Mild pneumobilia is present. Palliative stenting of the extrahepatic bile duct has been performed with a a metallic biliary stent noted extending into the second portion of the duodenum. Gallbladder is unremarkable. 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 There is atrophy of the pancreatic body and tail again noted. Spleen: Unremarkable Adrenals/Urinary Tract: Adrenal glands are unremarkable. Kidneys are normal, without renal calculi, focal lesion, or hydronephrosis. Bladder is decompressed with a Foley catheter balloon seen within its lumen. Stomach/Bowel: The stomach is mildly distended and fluid-filled. The pancreatic mass abuts an narrows the first portion of the duodenum, best  seen on image # 31/5, possibly resulting in some degree of gastric outlet obstruction. The stomach, small bowel, and large bowel are otherwise unremarkable. Mild ascites has developed. Vascular/Lymphatic: Mild aortoiliac atherosclerotic calcification. No aortic aneurysm. Multiple necrotic retroperitoneal lymph nodes have developed, in keeping with developing nodal metastases within the a perirenal region Reproductive: Prostate is unremarkable. Other: There is marked diffuse body wall wasting and subcutaneous edema noted. There is extensive retroperitoneal edema which limits delineation of the retroperitoneal structures. Musculoskeletal: No acute bone abnormality. No lytic or blastic bone lesion. Review of the MIP images confirms the above findings. 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 Marked body wall wasting with severe anasarca. No pulmonary embolism. Interval development of left ventricular dilation. Diffuse interstitial and ground-glass alveolar pulmonary infiltrate most in keeping with interstitial and  alveolar pulmonary edema, possibly cardiogenic in nature. Electronically Signed   By: Fidela Salisbury M.D.   On: 11/29/2021 01:04   DG Chest Portable 1 View  Result Date: 11/28/2021 CLINICAL DATA:  Intubation EXAM: PORTABLE CHEST 1 VIEW COMPARISON:  11/28/2021 at 10:17 p.m. FINDINGS: Unchanged position of endotracheal tube with tip at the level of the clavicular heads. Esophageal catheter has been advanced but the side port is still above the gastroesophageal junction and should be advanced by 7-10 cm further. Otherwise unchanged examination. IMPRESSION: 1. Unchanged position of endotracheal tube. 2. Esophageal catheter side port still above the gastroesophageal junction. Recommend advancement by 7-10 cm further. 3. Unchanged appearance of the lungs. No pneumothorax. Electronically Signed   By: Ulyses Jarred M.D.   On: 11/28/2021 23:19   DG Chest Port 1 View  Result Date: 11/28/2021 CLINICAL DATA:  Questionable sepsis.  ET tube placement. EXAM: PORTABLE CHEST 1 VIEW COMPARISON:  07/12/2021 FINDINGS: Endotracheal tube is 5 cm above the carina. Right Port-A-Cath in place with the tip in the right atrium, unchanged. Heart is normal size. Vascular congestion and peribronchial thickening. No confluent opacities or effusions. No acute bony abnormality. IMPRESSION: Mild vascular congestion. Bronchitic changes. Electronically Signed   By: Rolm Baptise M.D.   On: 11/28/2021 22:40    Procedures .Critical Care Performed by: Hayden Rasmussen, MD Authorized by: Hayden Rasmussen, MD   Critical care provider statement:    Critical care time (minutes):  45   Critical care time was exclusive of:  Separately billable procedures and treating other patients   Critical care was necessary to treat or prevent imminent or life-threatening deterioration of the following conditions:  Shock   Critical care was time spent personally by me on the following activities:  Development of treatment plan with patient or surrogate,  discussions with consultants, evaluation of patient's response to treatment, examination of patient, obtaining history from patient or surrogate, ordering and performing treatments and interventions, ordering and review of laboratory studies, ordering and review of radiographic studies, pulse oximetry, re-evaluation of patient's condition and review of old charts   I assumed direction of critical care for this patient from another provider in my specialty: no   Procedure Name: Intubation Date/Time: 11/28/2021 10:24 PM Performed by: Hayden Rasmussen, MD Pre-anesthesia Checklist: Patient identified, Patient being monitored, Emergency Drugs available, Timeout performed and Suction available Oxygen Delivery Method: Ambu bag Preoxygenation: Pre-oxygenation with 100% oxygen Induction  Type: Rapid sequence Ventilation: Mask ventilation without difficulty Laryngoscope Size: Glidescope and 3 Grade View: Grade II Tube size: 7.5 mm Number of attempts: 1 Placement Confirmation: ETT inserted through vocal cords under direct vision, CO2 detector and Breath sounds checked- equal and bilateral Secured at: 24 cm Tube secured with: ETT holder Dental Injury: Teeth and Oropharynx as per pre-operative assessment  Difficulty Due To: Difficulty was anticipated       Medications Ordered in ED Medications  phenylephrine 0.4-0.9 MG/10ML-% injection (  Not Given 11/29/21 0236)  etomidate (AMIDATE) injection ( Intravenous Canceled Entry 11/28/21 2215)  phenylephrine (NEO-SYNEPHRINE) injection (80 mcg  Given 11/28/21 2203)  norepinephrine (LEVOPHED) 51m in 2550m(0.016 mg/mL) premix infusion (2 mcg/min Intravenous Infusion Verify 11/29/21 0700)  fentaNYL (SUBLIMAZE) bolus via infusion 50 mcg (has no administration in time range)  docusate sodium (COLACE) capsule 100 mg (has no administration in time range)  polyethylene glycol (MIRALAX / GLYCOLAX) packet 17 g (has no administration in time range)  enoxaparin  (LOVENOX) injection 40 mg (has no administration in time range)  fentaNYL 250075min NS [id number]) infusion-PREMIX (100 mcg/hr Intravenous IV Pump Association 11/29/21 0412)  fentaNYL (SUBLIMAZE) bolus via infusion 50-100 mcg (100 mcg Intravenous Bolus from Bag 11/29/21 0043)  cefTRIAXone (ROCEPHIN) 2 g in sodium chloride 0.9 % 100 mL IVPB (0 g Intravenous Paused 11/29/21 0244)  azithromycin (ZITHROMAX) 500 mg in sodium chloride 0.9 % 250 mL IVPB (0 mg Intravenous Stopped 11/29/21 0425)  vancomycin (VANCOREADY) IVPB 1500 mg/300 mL (0 mg Intravenous Stopped 11/29/21 0645)    Followed by  vancomycin (VANCOCIN) IVPB 1000 mg/200 mL premix (has no administration in time range)  midazolam (VERSED) injection 4 mg (has no administration in time range)  dexmedetomidine (PRECEDEX) 400 MCG/100ML (4 mcg/mL) infusion (0.4 mcg/kg/hr  68 kg Intravenous Infusion Verify 11/29/21 0700)  dextrose 10 % and 0.45 % NaCl infusion ( Intravenous Infusion Verify 11/29/21 0700)  chlorhexidine gluconate (MEDLINE KIT) (PERIDEX) 0.12 % solution 15 mL (15 mLs Mouth Rinse Given 11/29/21 0754)  MEDLINE mouth rinse (15 mLs Mouth Rinse Given 11/29/21 0611)  pantoprazole sodium (PROTONIX) 40 mg/20 mL oral suspension 40 mg (has no administration in time range)  dextrose 50 % solution 50 mL (50 mLs Intravenous Given 11/28/21 2219)  magnesium sulfate IVPB 2 g 50 mL (0 g Intravenous Stopped 11/29/21 0025)  potassium chloride 10 mEq in 100 mL IVPB (10 mEq Intravenous New Bag/Given 11/29/21 0728)  fentaNYL (SUBLIMAZE) injection 50 mcg (50 mcg Intravenous Given 11/28/21 2351)  fentaNYL (SUBLIMAZE) injection 50 mcg (50 mcg Intravenous Given 11/29/21 0025)  iohexol (OMNIPAQUE) 350 MG/ML injection 85 mL (85 mLs Intravenous Contrast Given 11/29/21 0037)  dextrose 50 % solution 50 mL (50 mLs Intravenous Given 11/29/21 0118)  calcium gluconate 2 g/ 100 mL sodium chloride IVPB (0 mg Intravenous Stopped 11/29/21 0428)    ED Course/ Medical Decision  Making/ A&P Clinical Course as of 11/29/21 0925 2426 FNancy Fetter26, 2023  2212 Called patient's wife and she confirmed he would want intubation and CPR if necessary. [MB]  2212 Patient was emergently intubated with rocuronium and etomidate.  Phenylephrine for blood pressure support.  Levophed started [MB]  2218 CBG Critically low at less than 10.  D50 ordered. [MB]  2232 Chest x-ray interpreted by me as no definite infiltrate.  ET tube slightly high.  Will advance 2 cm [MB]  2306 His wife is here and I updated her on the current plan. [MB]  2318 Discussed with critical  care Dr Ilda Mori.  He will come down and evaluate patient. [MB]    Clinical Course User Index [MB] Hayden Rasmussen, MD                           Medical Decision Making Amount and/or Complexity of Data Reviewed Labs: ordered. Radiology: ordered. ECG/medicine tests: ordered.  Risk Prescription drug management. Decision regarding hospitalization.  Damyn Weitzel was evaluated in Emergency Department on 11/28/2021 for the symptoms described in the history of present illness. He was evaluated in the context of the global COVID-19 pandemic, which necessitated consideration that the patient might be at risk for infection with the SARS-CoV-2 virus that causes COVID-19. Institutional protocols and algorithms that pertain to the evaluation of patients at risk for COVID-19 are in a state of rapid change based on information released by regulatory bodies including the CDC and federal and state organizations. These policies and algorithms were followed during the patient's care in the ED. This patient complains of generalized weakness progressing to unresponsiveness; this involves an extensive number of treatment Options and is a complaint that carries with it a high risk of complications and morbidity. The differential includes shock, bleed, stroke, intra-abdominal catastrophe, ACS, PE, pneumothorax, sepsis  I ordered, reviewed and  interpreted labs, which included CBC with normal white count, hemoglobin lower than priors, chemistries with low sodium low bicarb, fairly preserved renal function, LFTs mildly elevated fingerstick blood sugar undetectable, blood culture sent, lactate elevated, troponin elevated I ordered medication IV glucose, meds for intubation, IV fluids, IV antibiotics and reviewed PMP when indicated. I ordered imaging studies which included chest x-ray, CT head chest abdomen and pelvis and I independently    visualized and interpreted imaging which showed ET tube slightly high and adjusted.  Does have progression of pancreatic disease Additional history obtained from EMS and patient's wife Previous records obtained and reviewed in epic including prior oncology notes I consulted Dr. Ilda Mori critical care and discussed lab and imaging findings and discussed disposition.  Cardiac monitoring reviewed, sinus bradycardia Social determinants considered, ongoing tobacco use and financial instability Critical Interventions: Intubation and resuscitation  After the interventions stated above, I reevaluated the patient and found patient still to be profoundly ill although improved from arrival. Admission and further testing considered, patient will need admitted to ICU for further management.          Final Clinical Impression(s) / ED Diagnoses Final diagnoses:  Altered mental status, unspecified altered mental status type  Hypokalemia  Malignant neoplasm of pancreas, unspecified location of malignancy ([place])  Hypoglycemia  Hypotension, unspecified hypotension type    Rx / DC Orders ED Discharge Orders     None         Hayden Rasmussen, MD 11/29/21 [phone]

## 2021-11-28 NOTE — Consult Note (Addendum)
Neurology Consultation  Reason for Consult: Initially called as a code stroke for generalized weakness but then was canceled due to patient needing stabilization and possible hypotension and shock Referring Physician: Dr. Melina Copa  CC: Generalized weakness  History is obtained from: Chart review  HPI: Brandon Schmitt is a 61 y.o. male past medical history of pancreatic cancer, cardiomyopathy, with last known somewhere around 2 to 4 PM-EMS was called because he was having a difficult time standing and generally weak.  When the examined him, he was unable to walk and activated a code stroke for this change in his neurological status.  He started becoming less awake and his blood pressure systolic were in the 51O on the way.  Upon arrival into the ER, he was emergently taken into a room and required intubation and a fluid resuscitation due to exhibiting clinical features of shock. Glucose was normal for EMS, undetectably low for ER. There were no focalities noted on initial exam by the ED provider. Code stroke was canceled and routine consultation was requested.  Patient was unable to provide any history.   Chart review reveals that patient was diagnosed with pancreatic cancer and was trying to get treatment-had treatment at [facility] but further management with testing and therapy was getting cost prohibitive and he was holding off till his insurance changed later this year for further treatment.  He has been complaining of leg weakness and heaviness at least in the note from January from his primary care provider.  Severe fatigue was also reported. I am unsure as to what his baseline is.  Family is not available at the time of my encounter.   LKW: Between 2 to 4 PM tpa given?: no, outside the window Premorbid modified Rankin scale (mRS): Unable to ascertain ACZ:YSAYTK to obtain due to altered mental status.   Past Medical History:  Diagnosis Date   Alcohol use 02/13/2020   Blood clotting disorder  ([place]) 07/06/2016   Cancer ([place])    Cholelithiasis 02/13/2020   Congestive heart failure (CHF) ([place]) 12/09/2020   Dilated cardiomyopathy ([place]) 12/09/2020   Elevated transaminase level 02/13/2020   Goals of care, counseling/discussion 09/10/2021   Pancreatic adenocarcinoma ([place]) 03/31/2020   Tobacco abuse 02/13/2020   Total bilirubin, elevated 02/13/2020    Family History  Problem Relation Age of Onset   Heart disease Neg Hx    Diabetes Neg Hx    Social History:   reports that he has been smoking cigarettes and cigars. He has never used smokeless tobacco. He reports current alcohol use. He reports current drug use. Drug: Marijuana.  Medications  Current Facility-Administered Medications:    etomidate (AMIDATE) injection, , Intravenous, PRN, Hayden Rasmussen, MD, 10 mg at 11/28/21 2203   norepinephrine (LEVOPHED) 4mg  in 255mL (0.016 mg/mL) premix infusion, 0-40 mcg/min, Intravenous, Continuous, Hayden Rasmussen, MD, Last Rate: 11.25 mL/hr at 11/28/21 2220, 3 mcg/min at 11/28/21 2220   norepinephrine (LEVOPHED) 4mg  in 220mL (0.016 mg/mL) premix infusion, , Intravenous, Continuous PRN, Hayden Rasmussen, MD, Last Rate: 7.5 mL/hr at 11/28/21 2213, 2 mcg/min at 11/28/21 2213   phenylephrine (NEO-SYNEPHRINE) injection, , , PRN, Hayden Rasmussen, MD, 80 mcg at 11/28/21 2203   phenylephrine 0.4-0.9 MG/10ML-% injection, , , ,    rocuronium (ZEMURON) injection, , Intravenous, PRN, Hayden Rasmussen, MD, 80 mg at 11/28/21 2203  Current Outpatient Medications:    albuterol (VENTOLIN HFA) 108 (90 Base) MCG/ACT inhaler, Inhale 1-2 puffs into the lungs every 4 (four) hours as needed  for wheezing or shortness of breath., Disp: 18 g, Rfl: 1   aspirin EC 81 MG tablet, Take 325 mg by mouth daily as needed for mild pain. Swallow whole., Disp: , Rfl:    ENTRESTO 24-26 MG, TAKE 1 TABLET BY MOUTH TWICE DAILY, Disp: 60 tablet, Rfl: 1   famotidine (PEPCID) 20 MG tablet, Take 1 tablet (20 mg total) by mouth daily.,  Disp: 30 tablet, Rfl: 3   furosemide (LASIX) 40 MG tablet, Take 0.5 tablets (20 mg total) by mouth daily as needed., Disp: 45 tablet, Rfl: 0   hydrOXYzine (ATARAX) 10 MG tablet, Take 1 tablet (10 mg total) by mouth 3 (three) times daily as needed., Disp: 30 tablet, Rfl: 3   hydrOXYzine (ATARAX/VISTARIL) 10 MG tablet, Take 1 tablet (10 mg total) by mouth 3 (three) times daily as needed for anxiety., Disp: 30 tablet, Rfl: 0   Multiple Vitamins-Minerals (MULTIVITAMIN WITH MINERALS) tablet, Take 1 tablet by mouth daily. Unknown strenght, Disp: , Rfl:    Pancrelipase, Lip-Prot-Amyl, (PANCREAZE) 37000-97300 units CPEP, Take 3 capsules by mouth with breakfast, with lunch, and with evening meal., Disp: 600 capsule, Rfl: 3   traZODone (DESYREL) 50 MG tablet, Take 1 tablet (50 mg total) by mouth at bedtime., Disp: 7 tablet, Rfl: 0   traZODone (DESYREL) 50 MG tablet, Take 0.5-1 tablets (25-50 mg total) by mouth at bedtime as needed for sleep., Disp: 30 tablet, Rfl: 3   Exam: Current vital signs: BP (!) 79/65    Pulse 60    Resp 16    Ht 6\' 2"  (1.88 m)    SpO2 100%    BMI 19.23 kg/m  Vital signs in last 24 hours: Pulse Rate:  [54-61] 60 (02/26 2215) Resp:  [10-18] 16 (02/26 2215) BP: (79-94)/(62-71) 79/65 (02/26 2215) SpO2:  [76 %-100 %] 100 % (02/26 2215) FiO2 (%):  [100 %] 100 % (02/26 2220) General: Intubated sedated, extremely cachectic looking man HEENT: Normocephalic atraumatic with endotracheal tube in place CVS: Regular rhythm Respiratory: Vented Abdomen appears scalloped with loss of abdominal fat Neurological exam Sedated intubated-pupils equal round reactive light. No gaze preference or deviation No spontaneous movement No movement to noxious stimulation NIHSS 32   Labs I have reviewed labs in epic and the results pertinent to this consultation are:  CBC    Component Value Date/Time   WBC 4.2 09/10/2021 1126   WBC 6.8 07/13/2021 0158   RBC 3.83 (L) 09/10/2021 1126   HGB 9.9  (L) 11/28/2021 2227   HGB 11.5 (L) 09/10/2021 1126   HCT 29.0 (L) 11/28/2021 2227   PLT 184 09/10/2021 1126   MCV 89.0 09/10/2021 1126   MCH 30.0 09/10/2021 1126   MCHC 33.7 09/10/2021 1126   RDW 15.2 09/10/2021 1126   LYMPHSABS 0.9 09/10/2021 1126   MONOABS 0.4 09/10/2021 1126   EOSABS 0.1 09/10/2021 1126   BASOSABS 0.0 09/10/2021 1126    CMP     Component Value Date/Time   NA 144 11/28/2021 2227   NA 139 07/23/2021 1610   K 2.8 (L) 11/28/2021 2227   CL 93 (L) 11/28/2021 2227   CO2 29 09/10/2021 1126   GLUCOSE <20 (LL) 11/28/2021 2227   BUN 48 (H) 11/28/2021 2227   BUN 11 07/23/2021 1610   CREATININE 0.70 11/28/2021 2227   CREATININE 0.72 09/10/2021 1126   CALCIUM 9.2 09/10/2021 1126   PROT 6.2 (L) 09/10/2021 1126   ALBUMIN 3.7 09/10/2021 1126   AST 21 09/10/2021 1126   ALT  23 09/10/2021 1126   ALKPHOS 64 09/10/2021 1126   BILITOT 0.6 09/10/2021 1126   GFRNONAA >60 09/10/2021 1126   GFRAA >60 02/13/2020 1134    Imaging I have reviewed the images obtained:  CT-head-NAICP  CXR: IMPRESSION: Mild vascular congestion. Bronchitic changes.  Assessment:  61 year old man with pancreatic cancer brought in for concerns for generalized weakness and concern for a stroke because of his inability to walk sometime this afternoon. Going by his chart review it looks like he has been having leg weakness and feeling heaviness in legs along with severe fatigue. I am not sure at this stage if this is the spread of his malignancy that is making him generally weak or if there is any focality. Given the advanced pancreatic cancer, I do not think he will be a candidate for endovascular intervention irrespective. He was outside the window for IV thrombolysis as well as had profound hypoglycemia.  IMP Toxic metabolic encephalopathy Evaluate for stroke v Mets with brain MRI   Recommendations: Evaluate for toxic metabolic encephalopathy and hypotension.  Rule out sepsis. CT head-to rule  out acute bleed or large stroke -negative at this time. MRI of the brain might be helpful to rule out any evidence of ischemic infarcts Might need abdominal imaging for evaluation of progression of his pancreatic malignancy. Check ammonia levels No emergent stroke intervention indicated at this time. Plan discussed with Dr. Melina Copa Neurology will be available as needed. Please call with questions.   -- Amie Portland, MD Neurologist [facility] Pager: [phone]  CRITICAL CARE ATTESTATION Performed by: Amie Portland, MD Total critical care time: 32 minutes Critical care time was exclusive of separately billable procedures and treating other patients and/or supervising APPs/Residents/Students Critical care was necessary to treat or prevent imminent or life-threatening deterioration due to AMS, toxic metabolic encephalopathy  This patient is critically ill and at significant risk for neurological worsening and/or death and care requires constant monitoring. Critical care was time spent personally by me on the following activities: development of treatment plan with patient and/or surrogate as well as nursing, discussions with consultants, evaluation of patient's response to treatment, examination of patient, obtaining history from patient or surrogate, ordering and performing treatments and interventions, ordering and review of laboratory studies, ordering and review of radiographic studies, pulse oximetry, re-evaluation of patient's condition, participation in multidisciplinary rounds and medical decision making of high complexity in the care of this patient.

## 2021-11-29 ENCOUNTER — Other Ambulatory Visit (HOSPITAL_COMMUNITY): Payer: Managed Care, Other (non HMO)

## 2021-11-29 ENCOUNTER — Inpatient Hospital Stay (HOSPITAL_COMMUNITY): Payer: Managed Care, Other (non HMO)

## 2021-11-29 ENCOUNTER — Ambulatory Visit: Payer: Managed Care, Other (non HMO) | Admitting: Medical

## 2021-11-29 DIAGNOSIS — G9340 Encephalopathy, unspecified: Secondary | ICD-10-CM | POA: Diagnosis not present

## 2021-11-29 DIAGNOSIS — R4182 Altered mental status, unspecified: Secondary | ICD-10-CM | POA: Insufficient documentation

## 2021-11-29 DIAGNOSIS — L8990 Pressure ulcer of unspecified site, unspecified stage: Secondary | ICD-10-CM | POA: Insufficient documentation

## 2021-11-29 DIAGNOSIS — E876 Hypokalemia: Secondary | ICD-10-CM

## 2021-11-29 DIAGNOSIS — E43 Unspecified severe protein-calorie malnutrition: Secondary | ICD-10-CM | POA: Insufficient documentation

## 2021-11-29 LAB — BLOOD CULTURE ID PANEL (REFLEXED) - BCID2

## 2021-11-29 LAB — POCT I-STAT 7, (LYTES, BLD GAS, ICA,H+H)
Acid-Base Excess: 17 mmol/L — ABNORMAL HIGH (ref 0.0–2.0)
Bicarbonate: 43.6 mmol/L — ABNORMAL HIGH (ref 20.0–28.0)
Calcium, Ion: 0.98 mmol/L — ABNORMAL LOW (ref 1.15–1.40)
HCT: 27 % — ABNORMAL LOW (ref 39.0–52.0)
Hemoglobin: 9.2 g/dL — ABNORMAL LOW (ref 13.0–17.0)
O2 Saturation: 98 %
Patient temperature: 98.3
Potassium: 3 mmol/L — ABNORMAL LOW (ref 3.5–5.1)
Sodium: 143 mmol/L (ref 135–145)
TCO2: 45 mmol/L — ABNORMAL HIGH (ref 22–32)
pCO2 arterial: 61.7 mmHg — ABNORMAL HIGH (ref 32–48)
pH, Arterial: 7.457 — ABNORMAL HIGH (ref 7.35–7.45)
pO2, Arterial: 99 mmHg (ref 83–108)

## 2021-11-29 LAB — COMPREHENSIVE METABOLIC PANEL
ALT: 128 U/L — ABNORMAL HIGH (ref 0–44)
AST: 197 U/L — ABNORMAL HIGH (ref 15–41)
Albumin: 2.3 g/dL — ABNORMAL LOW (ref 3.5–5.0)
Alkaline Phosphatase: 153 U/L — ABNORMAL HIGH (ref 38–126)
Anion gap: 8 (ref 5–15)
BUN: 51 mg/dL — ABNORMAL HIGH (ref 6–20)
CO2: 38 mmol/L — ABNORMAL HIGH (ref 22–32)
Calcium: 7.5 mg/dL — ABNORMAL LOW (ref 8.9–10.3)
Chloride: 96 mmol/L — ABNORMAL LOW (ref 98–111)
Creatinine, Ser: 0.63 mg/dL (ref 0.61–1.24)
GFR, Estimated: >60 mL/min (ref >60)
Glucose, Bld: 149 mg/dL — ABNORMAL HIGH (ref 70–99)
Potassium: 3.8 mmol/L (ref 3.5–5.1)
Sodium: 142 mmol/L (ref 135–145)
Total Bilirubin: 2.7 mg/dL — ABNORMAL HIGH (ref 0.3–1.2)
Total Protein: 4.6 g/dL — ABNORMAL LOW (ref 6.5–8.1)

## 2021-11-29 LAB — I-STAT ARTERIAL BLOOD GAS, ED
Acid-Base Excess: 28 mmol/L — ABNORMAL HIGH (ref 0.0–2.0)
Bicarbonate: 50.7 mmol/L — ABNORMAL HIGH (ref 20.0–28.0)
Calcium, Ion: 0.92 mmol/L — ABNORMAL LOW (ref 1.15–1.40)
HCT: 27 % — ABNORMAL LOW (ref 39.0–52.0)
Hemoglobin: 9.2 g/dL — ABNORMAL LOW (ref 13.0–17.0)
O2 Saturation: 100 %
Patient temperature: 94.3
Potassium: 2.6 mmol/L — CL (ref 3.5–5.1)
Sodium: 142 mmol/L (ref 135–145)
TCO2: >50 mmol/L — ABNORMAL HIGH (ref 22–32)
pCO2 arterial: 35.2 mmHg (ref 32–48)
pH, Arterial: 7.761 (ref 7.35–7.45)
pO2, Arterial: 255 mmHg — ABNORMAL HIGH (ref 83–108)

## 2021-11-29 LAB — SALICYLATE LEVEL: Salicylate Lvl: <7 mg/dL — ABNORMAL LOW (ref 7.0–30.0)

## 2021-11-29 LAB — CBC
HCT: 27.6 % — ABNORMAL LOW (ref 39.0–52.0)
Hemoglobin: 9 g/dL — ABNORMAL LOW (ref 13.0–17.0)
MCH: 29.1 pg (ref 26.0–34.0)
MCHC: 32.6 g/dL (ref 30.0–36.0)
MCV: 89.3 fL (ref 80.0–100.0)
Platelets: 78 10*3/uL — ABNORMAL LOW (ref 150–400)
RBC: 3.09 MIL/uL — ABNORMAL LOW (ref 4.22–5.81)
RDW: 15.3 % (ref 11.5–15.5)
WBC: 6.7 10*3/uL (ref 4.0–10.5)
nRBC: 0 % (ref 0.0–0.2)

## 2021-11-29 LAB — CREATININE, SERUM
Creatinine, Ser: 0.62 mg/dL (ref 0.61–1.24)
GFR, Estimated: >60 mL/min (ref >60)

## 2021-11-29 LAB — GLUCOSE, CAPILLARY
Glucose-Capillary: 115 mg/dL — ABNORMAL HIGH (ref 70–99)
Glucose-Capillary: 89 mg/dL (ref 70–99)
Glucose-Capillary: 111 mg/dL — ABNORMAL HIGH (ref 70–99)
Glucose-Capillary: 151 mg/dL — ABNORMAL HIGH (ref 70–99)
Glucose-Capillary: 162 mg/dL — ABNORMAL HIGH (ref 70–99)
Glucose-Capillary: 193 mg/dL — ABNORMAL HIGH (ref 70–99)
Glucose-Capillary: 184 mg/dL — ABNORMAL HIGH (ref 70–99)

## 2021-11-29 LAB — APTT: aPTT: 34 seconds (ref 24–36)

## 2021-11-29 LAB — PHOSPHORUS: Phosphorus: 3.3 mg/dL (ref 2.5–4.6)

## 2021-11-29 LAB — MRSA NEXT GEN BY PCR, NASAL: MRSA by PCR Next Gen: NOT DETECTED

## 2021-11-29 LAB — PROTIME-INR
INR: 2.6 — ABNORMAL HIGH (ref 0.8–1.2)
Prothrombin Time: 27.5 seconds — ABNORMAL HIGH (ref 11.4–15.2)

## 2021-11-29 LAB — TROPONIN I (HIGH SENSITIVITY): Troponin I (High Sensitivity): 192 ng/L (ref <18)

## 2021-11-29 LAB — ACETAMINOPHEN LEVEL: Acetaminophen (Tylenol), Serum: <10 ug/mL — ABNORMAL LOW (ref 10–30)

## 2021-11-29 LAB — RESP PANEL BY RT-PCR (FLU A&B, COVID) ARPGX2
Influenza A by PCR: NEGATIVE
Influenza B by PCR: NEGATIVE
SARS Coronavirus 2 by RT PCR: NEGATIVE

## 2021-11-29 LAB — AMMONIA: Ammonia: 63 umol/L — ABNORMAL HIGH (ref 9–35)

## 2021-11-29 LAB — CBG MONITORING, ED: Glucose-Capillary: 50 mg/dL — ABNORMAL LOW (ref 70–99)

## 2021-11-29 LAB — MAGNESIUM: Magnesium: 2.2 mg/dL (ref 1.7–2.4)

## 2021-11-29 LAB — LACTIC ACID, PLASMA: Lactic Acid, Venous: 1.5 mmol/L (ref 0.5–1.9)

## 2021-11-29 MED ORDER — ORAL CARE MOUTH RINSE
15.0000 mL | OROMUCOSAL | Status: DC
  Administered 2021-11-29 – 2021-11-30 (×14): 15 mL via OROMUCOSAL

## 2021-11-29 MED ORDER — DEXTROSE-NACL 10-0.45 % IV SOLN
INTRAVENOUS | Status: DC
  Filled 2021-11-29 (×12): qty 1000

## 2021-11-29 MED ORDER — CHLORHEXIDINE GLUCONATE CLOTH 2 % EX PADS
6.0000 | MEDICATED_PAD | Freq: Every day | CUTANEOUS | Status: DC
  Administered 2021-11-29 – 2021-11-30 (×2): 6 via TOPICAL

## 2021-11-29 MED ORDER — MIDAZOLAM HCL 2 MG/2ML IJ SOLN: 4.0000 mg | INTRAMUSCULAR | Status: DC | PRN

## 2021-11-29 MED ORDER — DEXTROSE 50 % IV SOLN
INTRAVENOUS | Status: AC
  Administered 2021-11-29: 50 mL via INTRAVENOUS
  Filled 2021-11-29: qty 50

## 2021-11-29 MED ORDER — DEXTROSE 50 % IV SOLN: 1.0000 | Freq: Once | INTRAVENOUS | Status: AC

## 2021-11-29 MED ORDER — PANTOPRAZOLE 2 MG/ML SUSPENSION
40.0000 mg | Freq: Every day | ORAL | Status: DC
  Administered 2021-11-29 – 2021-11-30 (×2): 40 mg
  Filled 2021-11-29 (×4): qty 20

## 2021-11-29 MED ORDER — CHLORHEXIDINE GLUCONATE 0.12% ORAL RINSE (MEDLINE KIT)
15.0000 mL | Freq: Two times a day (BID) | OROMUCOSAL | Status: DC
  Administered 2021-11-29 – 2021-11-30 (×3): 15 mL via OROMUCOSAL

## 2021-11-29 MED ORDER — CALCIUM GLUCONATE-NACL 2-0.675 GM/100ML-% IV SOLN
2.0000 g | Freq: Once | INTRAVENOUS | Status: AC
  Administered 2021-11-29: 2000 mg via INTRAVENOUS
  Filled 2021-11-29: qty 100

## 2021-11-29 MED ORDER — DEXMEDETOMIDINE HCL IN NACL 400 MCG/100ML IV SOLN
0.4000 ug/kg/h | INTRAVENOUS | Status: DC
  Administered 2021-11-29 (×2): 0.4 ug/kg/h via INTRAVENOUS
  Filled 2021-11-29: qty 100

## 2021-11-29 MED ORDER — IOHEXOL 350 MG/ML SOLN
85.0000 mL | Freq: Once | INTRAVENOUS | Status: AC | PRN
  Administered 2021-11-29: 85 mL via INTRAVENOUS

## 2021-11-29 NOTE — Consult Note (Addendum)
[facility] Nurse Consult Note: Reason for Consult: WOC consult requested for Stage 2 pressure injury to sacrum; 1X.5cm according to the wound care flow sheet, present on admission. This can be treated independently by the bedside nurses using the skin care order set using foam dressing to sacrum, change Q 3 days or PRN soiling. Please re-consult if further assistance is needed.  Thank-you,  Julien Girt MSN, [place], [place], [place], [place]

## 2021-11-29 NOTE — Progress Notes (Addendum)
[facility] Progress Note Patient Name: Brandon Schmitt DOB: 01-05-1961 MRN: [id number]   Date of Service  11/29/2021  HPI/Events of Note  Multiple issues: 1. ABG on 40%/PRVC 22/TV 400/P 5 = 7.761/35.2/255/50.7 . 2. Hypocalcemia - Ionized Ca++ = 0.92.  eICU Interventions  Plan: Decrease PRVC rate to 14. Repeat ABG at 5 AM. Will replace K+.     Intervention Category Major Interventions: Respiratory failure - evaluation and management;Acid-Base disturbance - evaluation and management  Brandon Schmitt 11/29/2021, 2:08 AM

## 2021-11-29 NOTE — Progress Notes (Addendum)
Initial Nutrition Assessment  DOCUMENTATION CODES:   Severe malnutrition in context of chronic illness, Underweight  INTERVENTION:   If continued supportive/aggressive care planned, will need to consider TPN if unable to feed enterally d/t gastric outlet obstruction.   NUTRITION DIAGNOSIS:   Severe Malnutrition related to chronic illness (pancreatic cancer) as evidenced by severe muscle depletion, severe fat depletion.  GOAL:   Patient will meet greater than or equal to 90% of their needs  MONITOR:   Vent status, Labs, I & O's, Skin  REASON FOR ASSESSMENT:   Ventilator    ASSESSMENT:   61 yo male admitted with AMS. PMH includes pancreatic cancer, smoker, dilated cardiomyopathy, alcohol use, CHF.  Spoke with patient's significant other and his son at bedside. Patient has been eating poorly for the past several months d/t decreased appetite. He has been losing weight for the past 1.5 years.   CT angio chest showed progression of primary mass in pancreatic head and progression of metastatic disease. Per results of CT abd/pelvis today, patient could have some degree of gastric outlet obstruction d/t pancreatic mass narrowing the first portion of the duodenum. Family is deciding on goals of care. His code status is now DNR. OG tube in place. Tip is in the body of the stomach. Per discussion with RN, not planning to begin TF at this time d/t GOO. He is severely malnourished; will need to consider TPN if continued supportive care continues.   Patient is currently intubated on ventilator support. MV: 6.1 L/min Temp (24hrs), Avg:95.3 F (35.2 C), Min:91.6 F (33.1 C), Max:98.2 F (36.8 C)   Labs reviewed. K 3, ionized calcium 0.98 CBG: 111-151  Medications reviewed and include Fentanyl, Precedex, Levophed, KCl, calcium gluconate. IVF: D10 1/2NS at 125 ml/h.  NUTRITION - FOCUSED PHYSICAL EXAM:  Flowsheet Row Most Recent Value  Orbital Region Severe depletion  Upper Arm  Region Severe depletion  Thoracic and Lumbar Region Severe depletion  Buccal Region Severe depletion  Temple Region Severe depletion  Clavicle Bone Region Severe depletion  Clavicle and Acromion Bone Region Severe depletion  Scapular Bone Region Severe depletion  Dorsal Hand Severe depletion  Patellar Region Severe depletion  Anterior Thigh Region Severe depletion  Posterior Calf Region Severe depletion  Edema (RD Assessment) Severe  Hair Reviewed  Eyes Unable to assess  Mouth Unable to assess  Skin Reviewed  Nails Reviewed       Diet Order:   Diet Order             Diet NPO time specified  Diet effective now                   EDUCATION NEEDS:   Not appropriate for education at this time  Skin:  Skin Assessment: Skin Integrity Issues: Skin Integrity Issues:: Stage II Stage II: coccyx  Last BM:  2/26  Height:   Ht Readings from Last 1 Encounters:  11/29/21 6\' 2"  (1.88 m)    Weight:   Wt Readings from Last 1 Encounters:  11/29/21 58.7 kg    BMI:  Body mass index is 16.62 kg/m.  Estimated Nutritional Needs:   Kcal:  5093-2671  Protein:  90-100 gm  Fluid:  >/= 1.8 L    Lucas Mallow RD, LDN, CNSC Please refer to Amion for contact information.

## 2021-11-29 NOTE — ED Notes (Addendum)
Pt not cleared for CT by EDP due to hypotension and AMS. Pt not responding to any stimuli with a decreased respiratory drive.Pt brought to ED room when the code stroke was cancelled. Pt placed on monitoring and Zoll pads by Staff. GCS 3. Pt being bagged with a BVM.

## 2021-11-29 NOTE — H&P (Signed)
NAME:  Brandon Schmitt, MRN:  [id number], DOB:  [birth year], LOS: 1 ADMISSION DATE:  11/28/2021, CONSULTATION DATE: November 29, 2021 REFERRING MD: ED, CHIEF COMPLAINT: Altered mental status  History of Present Illness:  Patient is 61 year old Caucasian male past medical history of smoking pancreatic cancer 2 years ago history of DVT not on anticoagulation more than 10 years ago who presented with generalized weakness then passing out his wife is with him she is saying that he woke up with his usual state of health has been declining in health and he was supposed to get an MRI he lost follow-up for his pancreatic cancer and currently not on any treatment she is saying that he is stage II he had dilated cardiomyopathy smokes no alcohol no drugs he is on Entresto and Pancreaze.  Patient had blood sugar of 128 by EMS but when he got here his blood sugar was undetectable and his GCS was 3 so he had to be intubated he was given D50 with no improvement.  CAT scans are pending the wife does not report any fever chills rigors nausea vomiting diarrhea chest pain she is saying he had cereal with brown sugar in the morning and he is not on insulin or antihyperglycemic agents  Objective   Blood pressure (!) 116/98, pulse 72, temperature (!) 93 F (33.9 C), resp. rate 17, height 6\' 2"  (1.88 m), weight 68 kg, SpO2 100 %.    Vent Mode: PRVC FiO2 (%):  [100 %] 100 % Set Rate:  [18 bmp] 18 bmp Vt Set:  [580 mL-650 mL] 650 mL PEEP:  [5 cmH20] 5 cmH20 Plateau Pressure:  [14 cmH20] 14 cmH20  No intake or output data in the 24 hours ending 11/29/21 0019 Filed Weights   11/28/21 2315  Weight: 68 kg    Examination: General: Intubated GCS 3 Neuro: GCS 3 HEENT:  atraumatic , no jaundice , dry mucous membranes  Cardiovascular:  Irregular irregular , ESM 2/6 in the aortic area  Lungs:  CTA bilateral , no wheezing or crackles  Abdomen:  Soft lax +BS , no tenderness . Musculoskeletal:  WNL , normal pulses  Skin:  No  rash    Assessment & Plan:    --Severe altered mental status encephalopathy code stroke was called and canceled patient is not focal could be due to hypoglycemia or any medication side effects even though the wife does not report any narcotics or sedatives or pancreatic meds since head CT is pending.  --GCS of 3 from the above hypoglycemia or toxic metabolic process or vascular process work-up is pending including head CT   --History of pancreatic cancer needs follow-up at some point as an outpatient  --Severe metabolic derangements will replete as needed  -- We will start empiric antibiotic till cultures are back given low-dose requirement of Levophed  -- Patient had remote history of DVT more than 10 years ago he at risk of hypercoagulable state given pancreatic cancer will be on Lovenox for DVT prophylaxis  -- Critical care time spent the care of this patient is 48 minutes. Labs   CBC: Recent Labs  Lab 11/28/21 2218 11/28/21 2227 11/28/21 2351  WBC 7.3  --   --   NEUTROABS 6.5  --   --   HGB 9.7* 9.9* 10.9*  HCT 29.2* 29.0* 32.0*  MCV 88.0  --   --   PLT 85*  --   --     Basic Metabolic Panel: Recent Labs  Lab 11/28/21 2218  11/28/21 2227 11/28/21 2351  NA 143 144 142  K 2.9* 2.8* 2.8*  CL 97* 93*  --   CO2 38*  --   --   GLUCOSE <20* <20*  --   BUN 53* 48*  --   CREATININE 0.56* 0.70  --   CALCIUM 6.9*  --   --    GFR: Estimated Creatinine Clearance: 95.6 mL/min (by C-G formula based on SCr of 0.7 mg/dL). Recent Labs  Lab 11/28/21 2218  WBC 7.3  LATICACIDVEN 0.5    Liver Function Tests: Recent Labs  Lab 11/28/21 2218  AST 125*  ALT 92*  ALKPHOS 109  BILITOT 2.4*  PROT 4.4*  ALBUMIN 2.1*   No results for input(s): LIPASE, AMYLASE in the last 168 hours. No results for input(s): AMMONIA in the last 168 hours.  ABG    Component Value Date/Time   PHART 7.801 (HH) 11/28/2021 2351   PCO2ART 32.7 11/28/2021 2351   PO2ART 516 (H) 11/28/2021 2351    HCO3 51.7 (H) 11/28/2021 2351   TCO2 >50 (H) 11/28/2021 2351   ACIDBASEDEF 1.0 09/29/2020 1546   O2SAT 100 11/28/2021 2351     Coagulation Profile: No results for input(s): INR, PROTIME in the last 168 hours.  Cardiac Enzymes: No results for input(s): CKTOTAL, CKMB, CKMBINDEX, TROPONINI in the last 168 hours.  HbA1C: No results found for: HGBA1C  CBG: Recent Labs  Lab 11/28/21 2217 11/28/21 2245  GLUCAP <10* 117*    Review of Systems:   Unable to obtain due to patient condition  Past Medical History:  He,  has a past medical history of Alcohol use (02/13/2020), Blood clotting disorder ([place]) (07/06/2016), Cancer ([place]), Cholelithiasis (02/13/2020), Congestive heart failure (CHF) ([place]) (12/09/2020), Dilated cardiomyopathy ([place]) (12/09/2020), Elevated transaminase level (02/13/2020), Goals of care, counseling/discussion (09/10/2021), Pancreatic adenocarcinoma ([place]) (03/31/2020), Tobacco abuse (02/13/2020), and Total bilirubin, elevated (02/13/2020).   Surgical History:   Past Surgical History:  Procedure Laterality Date   BILIARY STENT PLACEMENT N/A 02/19/2020   Procedure: BILIARY STENT PLACEMENT;  Surgeon: Arta Silence, MD;  Location: [facility];  Service: Endoscopy;  Laterality: N/A;   ENDOSCOPIC RETROGRADE CHOLANGIOPANCREATOGRAPHY (ERCP) WITH PROPOFOL N/A 02/19/2020   Procedure: ENDOSCOPIC RETROGRADE CHOLANGIOPANCREATOGRAPHY (ERCP) WITH PROPOFOL;  Surgeon: Arta Silence, MD;  Location: [facility];  Service: Endoscopy;  Laterality: N/A;   ENDOSCOPIC RETROGRADE CHOLANGIOPANCREATOGRAPHY (ERCP) WITH PROPOFOL N/A 02/19/2020   Procedure: ENDOSCOPIC RETROGRADE CHOLANGIOPANCREATOGRAPHY (ERCP) WITH PROPOFOL;  Surgeon: Clarene Essex, MD;  Location: [facility];  Service: Endoscopy;  Laterality: N/A;  EUS FIRST THEN ERCP   ESOPHAGOGASTRODUODENOSCOPY (EGD) WITH PROPOFOL N/A 02/19/2020   Procedure: ESOPHAGOGASTRODUODENOSCOPY (EGD) WITH PROPOFOL;  Surgeon: Arta Silence, MD;  Location: [facility];  Service: Endoscopy;  Laterality: N/A;   EUS N/A 02/19/2020   Procedure: UPPER ENDOSCOPIC ULTRASOUND (EUS) LINEAR;  Surgeon: Arta Silence, MD;  Location: [facility];  Service: Endoscopy;  Laterality: N/A;  EUS FIRST THEN ERCP with DR MAGOD   FINE NEEDLE ASPIRATION N/A 02/19/2020   Procedure: FINE NEEDLE ASPIRATION (FNA) LINEAR;  Surgeon: Arta Silence, MD;  Location: [facility];  Service: Endoscopy;  Laterality: N/A;   SPHINCTEROTOMY  02/19/2020   Procedure: SPHINCTEROTOMY;  Surgeon: Arta Silence, MD;  Location: [facility];  Service: Endoscopy;;     Social History:   reports that he has been smoking cigarettes and cigars. He has never used smokeless tobacco. He reports current alcohol use. He reports current drug use. Drug: Marijuana.   Family History:  His family history is negative for  Heart disease and Diabetes.   Allergies Allergies  Allergen Reactions   Irinotecan Other (See Comments) and Shortness Of Breath    Tachycardia, hypertension, respiratory distress with productive cough; (onset of symptoms was 30 minutes after oxaliplatin infusion and 30 minutes into the irinotecan infusion) cycle 7 of 8,     Home Medications  Prior to Admission medications   Medication Sig Start Date End Date Taking? Authorizing Provider  albuterol (VENTOLIN HFA) 108 (90 Base) MCG/ACT inhaler Inhale 1-2 puffs into the lungs every 4 (four) hours as needed for wheezing or shortness of breath. 08/09/21   Park Liter, MD  aspirin EC 81 MG tablet Take 325 mg by mouth daily as needed for mild pain. Swallow whole.    [provider]  ENTRESTO 24-26 MG TAKE 1 TABLET BY MOUTH TWICE DAILY 10/19/21   Park Liter, MD  famotidine (PEPCID) 20 MG tablet Take 1 tablet (20 mg total) by mouth daily. 09/30/21   Saguier, Percell Miller, PA-C  furosemide (LASIX) 40 MG tablet Take 0.5 tablets (20 mg total) by mouth daily as needed. 10/15/21 01/13/22  Park Liter, MD  hydrOXYzine  (ATARAX) 10 MG tablet Take 1 tablet (10 mg total) by mouth 3 (three) times daily as needed. 09/30/21   Saguier, Percell Miller, PA-C  hydrOXYzine (ATARAX/VISTARIL) 10 MG tablet Take 1 tablet (10 mg total) by mouth 3 (three) times daily as needed for anxiety. 07/13/21   Hosie Poisson, MD  Multiple Vitamins-Minerals (MULTIVITAMIN WITH MINERALS) tablet Take 1 tablet by mouth daily. Unknown strenght    [provider]  Pancrelipase, Lip-Prot-Amyl, (PANCREAZE) [id number] units CPEP Take 3 capsules by mouth with breakfast, with lunch, and with evening meal. 07/13/21   Hosie Poisson, MD  traZODone (DESYREL) 50 MG tablet Take 1 tablet (50 mg total) by mouth at bedtime. 07/13/21   Hosie Poisson, MD  traZODone (DESYREL) 50 MG tablet Take 0.5-1 tablets (25-50 mg total) by mouth at bedtime as needed for sleep. 09/30/21   Saguier, Percell Miller, PA-C     Critical care time: Critical care time spent the care of this patient is 48 minutes

## 2021-11-29 NOTE — Progress Notes (Signed)
PHARMACY - PHYSICIAN COMMUNICATION CRITICAL VALUE ALERT - BLOOD CULTURE IDENTIFICATION (BCID)  Brandon Schmitt is an 61 y.o. male who presented to [facility] on 11/28/2021 with a chief complaint of encephalopathy.  Assessment:  Pt with kleb pnemo bacteremia  Name of physician (or Provider) Contacted: Dr. Jennye Moccasin  Current antibiotics: Rocephin 2gm IV q24h  Changes to prescribed antibiotics recommended:  Patient is on recommended antibiotics - No changes needed  Results for orders placed or performed during the hospital encounter of 11/28/21  Blood Culture ID Panel (Reflexed) (Collected: 11/28/2021 11:19 PM)  Result Value Ref Range   Enterococcus faecalis NOT DETECTED NOT DETECTED   Enterococcus Faecium NOT DETECTED NOT DETECTED   Listeria monocytogenes NOT DETECTED NOT DETECTED   Staphylococcus species NOT DETECTED NOT DETECTED   Staphylococcus aureus (BCID) NOT DETECTED NOT DETECTED   Staphylococcus epidermidis NOT DETECTED NOT DETECTED   Staphylococcus lugdunensis NOT DETECTED NOT DETECTED   Streptococcus species NOT DETECTED NOT DETECTED   Streptococcus agalactiae NOT DETECTED NOT DETECTED   Streptococcus pneumoniae NOT DETECTED NOT DETECTED   Streptococcus pyogenes NOT DETECTED NOT DETECTED   A.calcoaceticus-baumannii NOT DETECTED NOT DETECTED   Bacteroides fragilis NOT DETECTED NOT DETECTED   Enterobacterales DETECTED (A) NOT DETECTED   Enterobacter cloacae complex NOT DETECTED NOT DETECTED   Escherichia coli NOT DETECTED NOT DETECTED   Klebsiella aerogenes NOT DETECTED NOT DETECTED   Klebsiella oxytoca NOT DETECTED NOT DETECTED   Klebsiella pneumoniae DETECTED (A) NOT DETECTED   Proteus species NOT DETECTED NOT DETECTED   Salmonella species NOT DETECTED NOT DETECTED   Serratia marcescens NOT DETECTED NOT DETECTED   Haemophilus influenzae NOT DETECTED NOT DETECTED   Neisseria meningitidis NOT DETECTED NOT DETECTED   Pseudomonas aeruginosa NOT DETECTED NOT DETECTED    Stenotrophomonas maltophilia NOT DETECTED NOT DETECTED   Candida albicans NOT DETECTED NOT DETECTED   Candida auris NOT DETECTED NOT DETECTED   Candida glabrata NOT DETECTED NOT DETECTED   Candida krusei NOT DETECTED NOT DETECTED   Candida parapsilosis NOT DETECTED NOT DETECTED   Candida tropicalis NOT DETECTED NOT DETECTED   Cryptococcus neoformans/gattii NOT DETECTED NOT DETECTED   CTX-M ESBL NOT DETECTED NOT DETECTED   Carbapenem resistance IMP NOT DETECTED NOT DETECTED   Carbapenem resistance KPC NOT DETECTED NOT DETECTED   Carbapenem resistance NDM NOT DETECTED NOT DETECTED   Carbapenem resist OXA 48 LIKE NOT DETECTED NOT DETECTED   Carbapenem resistance VIM NOT DETECTED NOT DETECTED    Sherlon Handing, PharmD, BCPS Please see amion for complete clinical pharmacist phone list 11/29/2021  9:54 PM

## 2021-11-29 NOTE — Progress Notes (Signed)
NAME:  Brandon Schmitt, MRN:  [id number], DOB:  Feb 28, 1961, LOS: 1 ADMISSION DATE:  11/28/2021, CONSULTATION DATE:  11/29/2021 REFERRING MD:  Emergency Department , Melina Copa CHIEF COMPLAINT:  Altered Mental Status   History of Present Illness:  Patient is 61 year old Caucasian male past medical history of smoking pancreatic cancer 2 years ago history of DVT not on anticoagulation more than 10 years ago who presented with generalized weakness then passing out his wife is with him she is saying that he woke up with his usual state of health has been declining in health and he was supposed to get an MRI he lost follow-up for his pancreatic cancer and currently not on any treatment she is saying that he is stage II he had dilated cardiomyopathy smokes no alcohol no drugs he is on Entresto and Pancreaze.  Patient had blood sugar of 128 by EMS but when he got here his blood sugar was undetectable and his GCS was 3 so he had to be intubated he was given D50 with no improvement.  CAT scans are pending the wife does not report any fever chills rigors nausea vomiting diarrhea chest pain she is saying he had cereal with brown sugar in the morning and he is not on insulin or antihyperglycemic agents  Pertinent  Medical History  DVT Pancreatic adenocarcinoma, followed at [facility] Events: Including procedures, antibiotic start and stop dates in addition to other pertinent events   Admit to ICU am of 2/27 with AMS. Initially code stroke, and cancelled due to hypoglycemia and hypotension.   Interim History / Subjective:  Elink called for respiratory failure and adjustment of vent.  Patient intubated and sedated on exam.  Objective   Blood pressure 91/73, pulse (!) 58, temperature 98.2 F (36.8 C), resp. rate 14, height 6\' 2"  (1.88 m), weight 58.7 kg, SpO2 98 %.    Vent Mode: PRVC FiO2 (%):  [40 %-100 %] 40 % Set Rate:  [14 bmp-22 bmp] 14 bmp Vt Set:  [400 mL-650 mL] 400 mL PEEP:  [5  cmH20] 5 cmH20 Plateau Pressure:  [13 cmH20-15 cmH20] 15 cmH20   Intake/Output Summary (Last 24 hours) at 11/29/2021 8182 Last data filed at 11/29/2021 0600 Gross per 24 hour  Intake 1431.63 ml  Output 1125 ml  Net 306.63 ml   Filed Weights   11/28/21 2315 11/29/21 0142  Weight: 68 kg 58.7 kg    Examination: General:  In bed on vent, cachetic with temporal wasting HENT: NCAT ETT in place PULM: CTAB, vent supported breathing CV: RRR, no mgr. GI: BS+, soft Ext: 2 + pitting edema in ext Neuro: sedated on vent  Micro: Blood cultures NGTD at 12 hours PCR negative Urine culture pending   Resolved Hospital Problem list     Assessment & Plan:  Acute hypoxic respiratory failure in setting of acute metabolic encephalopathy Metabolic alkalosis , hypokalemia Mild Hypothermia  -Patient came in altered with initial concern concern for stroke, CT head showed no acute intracranial pathology. - He has history as below of pancreatic adenocarcinoma. CT angio chest PE, showed progression of primary mass in pancreatic head and progression of metastatic disease.  Masses is shown to be pressing on the duodenum. - Expect progression of pancreatic adenocarcinoma and obstruction have caused decreased p.o. intake and patient is continue to take Lasix.  Labs consistent with severe contraction alkalosis and also hypokalemia from Lasix. - Maintain full vent support with SAT/SBT as tolerated - titrate Vent setting to maintain  SpO2 greater than or equal to 90%. - HOB elevated 30 degrees. - Plateau pressures less than 30 cm H20.  - Follow chest x-ray, ABG prn.   - Bronchial hygiene and RT/bronchodilator protocol. - PAD protocol, RAAS -1 - Levophed to maintain MAP >65, wean as tolerated - Suspect patient is intravascular volume down, hold Lasix - Repeat labs this am, CMP , Mg, Phos.  - Cultures so far no suggesting this is septic shock, PCR nares negative, stop Vanc - Continue CAP coverage today,  likely can DC if patients cultures are negative.   Pancreatic adenocarcinoma, diagnosed June 2021 , status post 7 cycles of Folfirinox.  -Initial [facility] planned for December 2021 and patient requested to reschedule due to holidays. Appears by chart patient has not had any contact with [facility] since voicemail left by Oncology 12/01/2020 asking if patient would like to schedule surgery.  Patient waiting on better insurance to help with cost of surgery. -CT angio chest on admission showing progression of primary mass in pancreatic head and progression of metastatic disease, see report for details - Shared news with son and wife bedside.  Patient not a good surgical candidate but offered to consult surgery for evaluation.  Patient's family said he did not want surgery and did not want me to contact surgery.  Giving family time to process and will revisit after lunch to discuss goals of care.  Overall poor prognosis.  Systolic Heart Failure Hypoalbuminemia  - pulmonary edema on CT and pitting edema on exam. Requiring low dose levo at this time.  - Expect patient is intravascularly volume down.  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  His significant other and son are bedside.  Son has talked to patients other children who are flying in to town.  They would like patient to be DNR. Continue full spectrum of care at this time.   Best Practice (right click and "Reselect all SmartList Selections" daily)   Diet/type: NPO DVT prophylaxis: LMWH GI prophylaxis: PPI Lines: N/A Foley:  Yes, and it is still needed Code Status:  DNR Last date of multidisciplinary goals of care discussion [x]   Tamsen Snider, MD PGY3 Internal Medicine [phone]

## 2021-11-30 DIAGNOSIS — R4182 Altered mental status, unspecified: Secondary | ICD-10-CM | POA: Diagnosis not present

## 2021-11-30 DIAGNOSIS — R6521 Severe sepsis with septic shock: Secondary | ICD-10-CM

## 2021-11-30 DIAGNOSIS — B961 Klebsiella pneumoniae [K. pneumoniae] as the cause of diseases classified elsewhere: Secondary | ICD-10-CM

## 2021-11-30 DIAGNOSIS — G9340 Encephalopathy, unspecified: Secondary | ICD-10-CM | POA: Diagnosis not present

## 2021-11-30 DIAGNOSIS — C259 Malignant neoplasm of pancreas, unspecified: Secondary | ICD-10-CM

## 2021-11-30 DIAGNOSIS — E162 Hypoglycemia, unspecified: Secondary | ICD-10-CM

## 2021-11-30 DIAGNOSIS — A419 Sepsis, unspecified organism: Secondary | ICD-10-CM | POA: Diagnosis not present

## 2021-11-30 DIAGNOSIS — R7881 Bacteremia: Secondary | ICD-10-CM | POA: Diagnosis not present

## 2021-11-30 LAB — GLUCOSE, CAPILLARY
Glucose-Capillary: 100 mg/dL — ABNORMAL HIGH (ref 70–99)
Glucose-Capillary: 112 mg/dL — ABNORMAL HIGH (ref 70–99)
Glucose-Capillary: 117 mg/dL — ABNORMAL HIGH (ref 70–99)
Glucose-Capillary: 118 mg/dL — ABNORMAL HIGH (ref 70–99)
Glucose-Capillary: 13 mg/dL — CL (ref 70–99)
Glucose-Capillary: 170 mg/dL — ABNORMAL HIGH (ref 70–99)
Glucose-Capillary: 191 mg/dL — ABNORMAL HIGH (ref 70–99)
Glucose-Capillary: 191 mg/dL — ABNORMAL HIGH (ref 70–99)
Glucose-Capillary: 214 mg/dL — ABNORMAL HIGH (ref 70–99)
Glucose-Capillary: 219 mg/dL — ABNORMAL HIGH (ref 70–99)
Glucose-Capillary: 31 mg/dL — CL (ref 70–99)
Glucose-Capillary: 47 mg/dL — ABNORMAL LOW (ref 70–99)
Glucose-Capillary: 72 mg/dL (ref 70–99)

## 2021-11-30 LAB — MAGNESIUM: Magnesium: 2.1 mg/dL (ref 1.7–2.4)

## 2021-11-30 LAB — CBC WITH DIFFERENTIAL/PLATELET
Abs Immature Granulocytes: 0.07 10*3/uL (ref 0.00–0.07)
Basophils Absolute: 0 10*3/uL (ref 0.0–0.1)
Basophils Relative: 0 %
Eosinophils Absolute: 0 10*3/uL (ref 0.0–0.5)
Eosinophils Relative: 0 %
HCT: 30.4 % — ABNORMAL LOW (ref 39.0–52.0)
Hemoglobin: 9.9 g/dL — ABNORMAL LOW (ref 13.0–17.0)
Immature Granulocytes: 1 %
Lymphocytes Relative: 5 %
Lymphs Abs: 0.6 10*3/uL — ABNORMAL LOW (ref 0.7–4.0)
MCH: 29.5 pg (ref 26.0–34.0)
MCHC: 32.6 g/dL (ref 30.0–36.0)
MCV: 90.5 fL (ref 80.0–100.0)
Monocytes Absolute: 0.3 10*3/uL (ref 0.1–1.0)
Monocytes Relative: 2 %
Neutro Abs: 10.4 10*3/uL — ABNORMAL HIGH (ref 1.7–7.7)
Neutrophils Relative %: 92 %
Platelets: 72 10*3/uL — ABNORMAL LOW (ref 150–400)
RBC: 3.36 MIL/uL — ABNORMAL LOW (ref 4.22–5.81)
RDW: 15.9 % — ABNORMAL HIGH (ref 11.5–15.5)
WBC: 11.3 10*3/uL — ABNORMAL HIGH (ref 4.0–10.5)
nRBC: 0.2 % (ref 0.0–0.2)

## 2021-11-30 LAB — COMPREHENSIVE METABOLIC PANEL
ALT: 125 U/L — ABNORMAL HIGH (ref 0–44)
AST: 145 U/L — ABNORMAL HIGH (ref 15–41)
Albumin: 2.3 g/dL — ABNORMAL LOW (ref 3.5–5.0)
Alkaline Phosphatase: 150 U/L — ABNORMAL HIGH (ref 38–126)
Anion gap: 7 (ref 5–15)
BUN: 45 mg/dL — ABNORMAL HIGH (ref 6–20)
CO2: 40 mmol/L — ABNORMAL HIGH (ref 22–32)
Calcium: 7.5 mg/dL — ABNORMAL LOW (ref 8.9–10.3)
Chloride: 95 mmol/L — ABNORMAL LOW (ref 98–111)
Creatinine, Ser: 0.71 mg/dL (ref 0.61–1.24)
GFR, Estimated: >60 mL/min (ref >60)
Glucose, Bld: 210 mg/dL — ABNORMAL HIGH (ref 70–99)
Potassium: 3.4 mmol/L — ABNORMAL LOW (ref 3.5–5.1)
Sodium: 142 mmol/L (ref 135–145)
Total Bilirubin: 2.1 mg/dL — ABNORMAL HIGH (ref 0.3–1.2)
Total Protein: 4.7 g/dL — ABNORMAL LOW (ref 6.5–8.1)

## 2021-11-30 LAB — URINE CULTURE: Culture: NO GROWTH

## 2021-11-30 LAB — C-PEPTIDE: C-Peptide: 0.1 ng/mL — ABNORMAL LOW (ref 1.1–4.4)

## 2021-11-30 MED ORDER — DEXTROSE-NACL 10-0.45 % IV SOLN
INTRAVENOUS | Status: AC
Start: 2021-11-30 — End: 2021-12-01
  Filled 2021-11-30 (×4): qty 1000

## 2021-11-30 MED ORDER — ORAL CARE MOUTH RINSE
15.0000 mL | Freq: Two times a day (BID) | OROMUCOSAL | Status: DC
  Administered 2021-11-30 – 2021-12-01 (×2): 15 mL via OROMUCOSAL

## 2021-11-30 MED ORDER — DEXTROSE 50 % IV SOLN: 25.0000 g | INTRAVENOUS | Status: AC

## 2021-11-30 MED ORDER — DEXTROSE 50 % IV SOLN
INTRAVENOUS | Status: AC
  Administered 2021-11-30: 25 g via INTRAVENOUS
  Filled 2021-11-30: qty 50

## 2021-11-30 MED ORDER — POTASSIUM CHLORIDE 10 MEQ/100ML IV SOLN
10.0000 meq | INTRAVENOUS | Status: AC
  Administered 2021-11-30 (×6): 10 meq via INTRAVENOUS
  Filled 2021-11-30 (×6): qty 100

## 2021-11-30 MED ORDER — DEXTROSE-NACL 5-0.45 % IV SOLN: INTRAVENOUS | Status: DC

## 2021-11-30 MED ORDER — DEXTROSE 50 % IV SOLN
25.0000 g | INTRAVENOUS | Status: AC
  Administered 2021-11-30: 25 g via INTRAVENOUS
  Filled 2021-11-30: qty 50

## 2021-11-30 NOTE — Progress Notes (Signed)
Patient signed out to [facility] and they will take over as primary on 3/1 at 7am. Transfer order placed.

## 2021-11-30 NOTE — Progress Notes (Signed)
Progress Update:  Patient extubated this afternoon without any issues. Partner at bedside. He was expressing that he would want to be reintubated. Currently DNR and in agreement with this.    Have consulted palliative care to make sure goals for comfort are aligned with hospice care. He is stable for med surg transfer with [facility] to assume care 3/1.   Additional CC time 41 minutes.

## 2021-11-30 NOTE — Procedures (Signed)
Extubation Procedure Note  Patient Details:   Name: Brandon Schmitt DOB: 05/04/1961 MRN: [id number]   Airway Documentation:    Vent end date: 11/30/21 Vent end time: 1452   Evaluation  O2 sats: stable throughout Complications: No apparent complications Patient did tolerate procedure well. Bilateral Breath Sounds: Clear, Diminished   Yes  Patient was extubated to a 4L [place] without any complications, dyspnea or stridor noted. Positive cuff leak prior to extubation.   [name] 11/30/2021, 2:52 PM

## 2021-11-30 NOTE — TOC Progression Note (Addendum)
Transition of Care [facility]) - Progression Note    Patient Details  Name: Brandon Schmitt MRN: [id number] Date of Birth: 03/17/1961  Transition of Care [facility]) CM/SW [place], RN Phone Number:[phone]  11/30/2021, 11:14 AM  Clinical Narrative:    Hospice consult referral has been sent to [facility] with [facility].  1200 CM received message that [facility] has received call for referral. CM at bedside to discuss choice with family . CM made family aware that CM understood that choice was [facility]. Family states that they have not made a choice for  hospice services. CM made family and patient aware of the choices for Hospice Care. Family states that they want to wait until the two brothers arrive. CM provided the names of facilities for hospice services and the contact info for CM. Family states that they will call CM when they make a choice. Shanita with [facility] has been made aware that family has not made a choice. CM made Cherie with [facility] aware that no decision has been made yet.          Expected Discharge Plan and Services                                                 Social Determinants of Health (SDOH) Interventions    Readmission Risk Interventions No flowsheet data found.

## 2021-11-30 NOTE — Progress Notes (Signed)
Hypoglycemic Event  CBG: 31  Treatment: D50 IV 25mg   Symptoms: None  Follow-up CBG: Time:1810 CBG Result:117  Possible Reasons for Event: Inadequate meal intake  Comments/MD notified:Hypoglycemic  Brandon Schmitt, [facility]

## 2021-11-30 NOTE — Progress Notes (Signed)
NAME:  Brandon Schmitt, MRN:  [id number], DOB:  1961/05/29, LOS: 2 ADMISSION DATE:  11/28/2021, CONSULTATION DATE:  11/29/2021 REFERRING MD:  Emergency Department , Melina Copa CHIEF COMPLAINT:  Altered Mental Status   History of Present Illness:  Patient is 61 year old Caucasian male past medical history of smoking pancreatic cancer 2 years ago history of DVT not on anticoagulation more than 10 years ago who presented with generalized weakness then passing out his wife is with him she is saying that he woke up with his usual state of health has been declining in health and he was supposed to get an MRI he lost follow-up for his pancreatic cancer and currently not on any treatment she is saying that he is stage II he had dilated cardiomyopathy smokes no alcohol no drugs he is on Entresto and Pancreaze.  Patient had blood sugar of 128 by EMS but when he got here his blood sugar was undetectable and his GCS was 3 so he had to be intubated he was given D50 with no improvement.  CAT scans are pending the wife does not report any fever chills rigors nausea vomiting diarrhea chest pain she is saying he had cereal with brown sugar in the morning and he is not on insulin or antihyperglycemic agents  Pertinent  Medical History  DVT Pancreatic adenocarcinoma, followed at [facility] Events: Including procedures, antibiotic start and stop dates in addition to other pertinent events   Admit to ICU am of 2/27 with AMS. Initially code stroke, and cancelled due to hypoglycemia and hypotension.  2/27 CT showing progression of metastatic pancreatic cancer, GOC discussion, patient made DNR and family members notified to visit patient  Interim History / Subjective:  Patient more alert on exam, intermittent somnolent on fentanyl. Precedex has been weaned off.   Objective   Blood pressure 99/64, pulse (!) 55, temperature (!) 97.5 F (36.4 C), resp. rate 10, height _0  (1.88 m), weight 58.7 kg, SpO2 100  %.    Vent Mode: PRVC FiO2 (%):  [40 %] 40 % Set Rate:  [14 bmp] 14 bmp Vt Set:  [400 mL] 400 mL PEEP:  [5 cmH20] 5 cmH20 Plateau Pressure:  [13 cmH20-14 cmH20] 14 cmH20   Intake/Output Summary (Last 24 hours) at 11/30/2021 7591 Last data filed at 11/30/2021 0604 Gross per 24 hour  Intake 3758.41 ml  Output 1685 ml  Net 2073.41 ml    Filed Weights   11/28/21 2315 11/29/21 0142  Weight: 68 kg 58.7 kg    Examination: General:  In bed on vent, eyes open on approach and off sedation HENT: NCAT ETT in place, temporal wasting PULM: vent supported breathing, bilateral rales , no wheezing CV: RRR, no mgr GI: BS+, soft MSK: cachectic  Neuro: on precedex, on fentyanyl 67mg intermittent somnolent, no following command  Labs: WBC 6.7 > 11.3 Hemoglobin 9 > 9.9 Platelet 78>72  Potassium 3.8 > 3.4 CO2 40 Cr .71, BUN 51 > 45 Glucose 210 AST 27 > 145 ALT 128 > 125 Alk phos 153 > 150 Total bilirubin 2.7 > 2.1  Micro: Blood cultures with gram-negative rods, aerobic bottle only.  Klebsiella pneumonia   Urine: 1.6L yesterday,  NG output: 300 ml  Stool:no Net:+ 1.7 L, .   Resolved Hospital Problem list     Assessment & Plan:  Neurologic Acute encephalopathy, septic and metabolic- improving - PAD protocol, continue to wean fentanyl - Ceftriaxone for Klebsiella pneumonia  Respiratory Acute hypoxic respiratory failure in  setting of acute metabolic encephalopathy - encephalopathy improving, on minimal vent settings this morning - Wean sedation - SBT as tolerated - titrate Vent setting to maintain SpO2 greater than or equal to 90%. - HOB elevated 30 degrees. - Plateau pressures less than 30 cm H20.  - Bronchial hygiene and RT/bronchodilator protocol.  Infection Septic Shock 2/2 Klebsiella pneumonia bacteremia, likely source is metastatic pancreatic cancer - Levo weaned off . Map goal > 65 - Continue ceftriaxone, Day 2 of 7   Cardiovascular HFrEF - Off levo - Hold  Lasix -Continue holding GDMT with low BP from sepsis  Hematology Normocytic anemia in setting of sepsis - Trend CBC  Metabolic Hypoglycemia , unable to take enteral feeding due to obstruction, hyperglycemic this am. Stop D10 Darryll Capers NS infusion and start D5/half NS at 50 ml/hr, goal BG 379 to 024 Metabolic Alkalosis - Expect this is contraction alkalosis from Lasix. Will continue to hold diuretics today as patient has just weaned off levo.  Hypokalemia- K 3.4 replete to K of 4 Eevated liver enzymes in setting of sepsis and metastatic disease  - Trend LFT's  Endocrine Hypoglycemia is setting of mechanical bowel obstruction  Nutrition Mechanical bowel obstruction secondary to pancreatic adenocarcinoma - continue D5 as above  GOC - GOC discussion on rounds with attending. Family is agreeable to recommendation of home with hospice if patient continues to improve.  - Hospice consulted   Best Practice (right click and "Reselect all SmartList Selections" daily)   Diet/type: NPO DVT prophylaxis: LMWH GI prophylaxis: PPI Lines: N/A Foley:  Yes, and it is still needed Code Status:  DNR Last date of multidisciplinary goals of care discussion _0   Tamsen Snider, MD PGY3 Internal Medicine [phone]

## 2021-12-01 ENCOUNTER — Inpatient Hospital Stay (HOSPITAL_COMMUNITY): Payer: Managed Care, Other (non HMO)

## 2021-12-01 DIAGNOSIS — Z7189 Other specified counseling: Secondary | ICD-10-CM

## 2021-12-01 DIAGNOSIS — Z515 Encounter for palliative care: Secondary | ICD-10-CM

## 2021-12-01 DIAGNOSIS — R7881 Bacteremia: Secondary | ICD-10-CM | POA: Diagnosis not present

## 2021-12-01 DIAGNOSIS — E43 Unspecified severe protein-calorie malnutrition: Secondary | ICD-10-CM | POA: Diagnosis not present

## 2021-12-01 DIAGNOSIS — C259 Malignant neoplasm of pancreas, unspecified: Secondary | ICD-10-CM | POA: Diagnosis not present

## 2021-12-01 DIAGNOSIS — G9340 Encephalopathy, unspecified: Secondary | ICD-10-CM | POA: Diagnosis not present

## 2021-12-01 DIAGNOSIS — Z66 Do not resuscitate: Secondary | ICD-10-CM

## 2021-12-01 LAB — ECHOCARDIOGRAM COMPLETE
AR max vel: 1.76 cm2
AV Peak grad: 4.6 mmHg
Ao pk vel: 1.08 m/s
Area-P 1/2: 5.66 cm2
Height: 74 in
MV M vel: 4.49 m/s
MV Peak grad: 80.6 mmHg
S' Lateral: 6.95 cm
Weight: 2070.56 oz

## 2021-12-01 LAB — COMPREHENSIVE METABOLIC PANEL
ALT: 200 U/L — ABNORMAL HIGH (ref 0–44)
AST: 394 U/L — ABNORMAL HIGH (ref 15–41)
Albumin: 2.2 g/dL — ABNORMAL LOW (ref 3.5–5.0)
Alkaline Phosphatase: 207 U/L — ABNORMAL HIGH (ref 38–126)
Anion gap: 8 (ref 5–15)
BUN: 45 mg/dL — ABNORMAL HIGH (ref 6–20)
CO2: 37 mmol/L — ABNORMAL HIGH (ref 22–32)
Calcium: 7.8 mg/dL — ABNORMAL LOW (ref 8.9–10.3)
Chloride: 97 mmol/L — ABNORMAL LOW (ref 98–111)
Creatinine, Ser: 0.62 mg/dL (ref 0.61–1.24)
GFR, Estimated: >60 mL/min (ref >60)
Glucose, Bld: 104 mg/dL — ABNORMAL HIGH (ref 70–99)
Potassium: 4.3 mmol/L (ref 3.5–5.1)
Sodium: 142 mmol/L (ref 135–145)
Total Bilirubin: 3.4 mg/dL — ABNORMAL HIGH (ref 0.3–1.2)
Total Protein: 4.9 g/dL — ABNORMAL LOW (ref 6.5–8.1)

## 2021-12-01 LAB — CBC WITH DIFFERENTIAL/PLATELET
Abs Immature Granulocytes: 0.11 10*3/uL — ABNORMAL HIGH (ref 0.00–0.07)
Basophils Absolute: 0.1 10*3/uL (ref 0.0–0.1)
Basophils Relative: 1 %
Eosinophils Absolute: 0 10*3/uL (ref 0.0–0.5)
Eosinophils Relative: 0 %
HCT: 33.6 % — ABNORMAL LOW (ref 39.0–52.0)
Hemoglobin: 10.9 g/dL — ABNORMAL LOW (ref 13.0–17.0)
Immature Granulocytes: 2 %
Lymphocytes Relative: 7 %
Lymphs Abs: 0.4 10*3/uL — ABNORMAL LOW (ref 0.7–4.0)
MCH: 29.1 pg (ref 26.0–34.0)
MCHC: 32.4 g/dL (ref 30.0–36.0)
MCV: 89.6 fL (ref 80.0–100.0)
Monocytes Absolute: 0.3 10*3/uL (ref 0.1–1.0)
Monocytes Relative: 4 %
Neutro Abs: 5 10*3/uL (ref 1.7–7.7)
Neutrophils Relative %: 86 %
Platelets: 55 10*3/uL — ABNORMAL LOW (ref 150–400)
RBC: 3.75 MIL/uL — ABNORMAL LOW (ref 4.22–5.81)
RDW: 15.6 % — ABNORMAL HIGH (ref 11.5–15.5)
WBC: 5.9 10*3/uL (ref 4.0–10.5)
nRBC: 0 % (ref 0.0–0.2)

## 2021-12-01 LAB — GLUCOSE, CAPILLARY
Glucose-Capillary: >600 mg/dL (ref 70–99)
Glucose-Capillary: 110 mg/dL — ABNORMAL HIGH (ref 70–99)
Glucose-Capillary: 123 mg/dL — ABNORMAL HIGH (ref 70–99)
Glucose-Capillary: 127 mg/dL — ABNORMAL HIGH (ref 70–99)
Glucose-Capillary: 110 mg/dL — ABNORMAL HIGH (ref 70–99)
Glucose-Capillary: 65 mg/dL — ABNORMAL LOW (ref 70–99)
Glucose-Capillary: 69 mg/dL — ABNORMAL LOW (ref 70–99)
Glucose-Capillary: 71 mg/dL (ref 70–99)

## 2021-12-01 LAB — PHOSPHORUS: Phosphorus: 2.7 mg/dL (ref 2.5–4.6)

## 2021-12-01 LAB — MAGNESIUM: Magnesium: 2 mg/dL (ref 1.7–2.4)

## 2021-12-01 MED ORDER — FENTANYL CITRATE (PF) 100 MCG/2ML IJ SOLN
50.0000 ug | INTRAMUSCULAR | Status: DC | PRN
  Administered 2021-12-01: 50 ug via INTRAVENOUS
  Filled 2021-12-01: qty 2

## 2021-12-01 MED ORDER — OXYCODONE HCL 5 MG PO TABS
10.0000 mg | ORAL_TABLET | Freq: Four times a day (QID) | ORAL | Status: DC | PRN
  Administered 2021-12-01: 10 mg via ORAL
  Filled 2021-12-01 (×3): qty 2

## 2021-12-01 MED ORDER — DEXTROSE 50 % IV SOLN
INTRAVENOUS | Status: AC
  Filled 2021-12-01: qty 50

## 2021-12-01 MED ORDER — DEXTROSE-NACL 10-0.45 % IV SOLN
INTRAVENOUS | Status: DC
  Filled 2021-12-01 (×3): qty 1000

## 2021-12-01 MED ORDER — CEFAZOLIN SODIUM-DEXTROSE 2-4 GM/100ML-% IV SOLN
2.0000 g | Freq: Three times a day (TID) | INTRAVENOUS | Status: DC
  Administered 2021-12-02 (×2): 2 g via INTRAVENOUS
  Filled 2021-12-01 (×5): qty 100

## 2021-12-01 MED ORDER — SODIUM CHLORIDE 0.9 % IV SOLN
INTRAVENOUS | Status: DC | PRN
Start: 2021-12-01 — End: 2021-12-02

## 2021-12-01 MED ORDER — DEXTROSE 50 % IV SOLN
12.5000 g | Freq: Once | INTRAVENOUS | Status: AC
  Administered 2021-12-01: 12.5 g via INTRAVENOUS

## 2021-12-01 NOTE — Consult Note (Signed)
?Consultation Note ?Date: 12/01/2021  ? ?Patient Name: Brandon Schmitt  ?DOB: 1961-08-07  MRN: [id number]  Age / Sex: 61 y.o., male  ?PCP: Mackie Pai, PA-C ?Referring Physician: Darliss Cheney, MD ? ?Reason for Consultation: Establishing goals of care ? ?HPI/Patient Profile: 61 y.o. male  with past medical history of dilated cardiomyopathy, pancreatic cancer diagnosed 2 years ago at [facility] lost to follow up, and DVT over ten years ago admitted on 11/28/2021 with passing out at home. When he arrived at the hospital his GCS was 3 and he required intubation. Extubated 2/28. Concern for acute on chronic CHF - last echo Feb 2022 revealed EF of 25-30%. Also diagnosed with septic shock d/t Klebsiella pneumonia bacteremia. Patient with ongoing hypoglycemia. 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 PMT consulted to discuss [place].  ? ?Clinical Assessment and Goals of Care: ?I have reviewed medical records including EPIC notes, labs and imaging, received report from RN, assessed the patient and then met with patient and 2 daughters at bedside to discuss diagnosis prognosis, GOC, EOL wishes, disposition and options. ? ?I introduced Palliative Medicine as specialized medical care for people living with serious illness. It focuses on providing relief from the symptoms and stress of a serious illness. The goal is to improve quality of life for both the patient and the family. ? ?Patient tells me he is not ready to discuss goals of care as his significant other is not at bedside today. He tells me she will be here tomorrow. He is agreeable to meeting tomorrow when she is at bedside. He tells me his symptoms are well  controlled - no complaints. ? ?Daughters ask to speak with my privately outside. They are tearful; emotional support provided. ? ? We discussed patient's current illness and what it means in the larger context of patient's on-going co-morbidities.  Natural disease trajectory and expectations at EOL were discussed. We discussed patient is likely quickly declining. They tell of their shock of his current condition as [place] and had not seen his decline. We discuss his metastatic cancer, recent sepsis, frailty, and malnutrition. Discuss high risk for acute decline.  ? ?I attempted to elicit values and goals of care important to the patient.  Daughters share patient very much wants to be out of  hospital and cannot go home as he is unable to adequately care for himself. We discuss rehab briefly - we discuss that patient is likely unable to participate in rehab and would likely not benefit him. We discuss hospice facility. Discuss philosophy of care - focus on his comfort. We discuss he would not receive aggressive medical care, would receive care aimed at improving his comfort. Daughters express they are interested in transitioning his care to a comfort focus and they believe he would be as well. They tell me he understands he is nearing end of life.  ? ?Discussed with family the importance of continued conversation with family and the medical providers regarding overall plan of care and treatment options, ensuring decisions are within the context of the patient?s values and GOCs.   ? ?Questions and concerns were addressed. The family was encouraged to call with questions or concerns.  ? ?Daughter called back later in day to share that  they had further discussions with patient and confirmed his interested in hospice facility and they would like to initiate transition to hospice home.  ? ?Primary Decision Maker ?PATIENT ?  ? ?SUMMARY OF RECOMMENDATIONS   ?- spoke to hospice liaison - family requested bed  at [facility] - family is touring facility today ?- family reports patient interested in comfort approach to his care, follow up meeting scheduled 3/2 9 am ? ?Code Status/Advance Care Planning: ?DNR ? ?Prognosis:  ?< 2 weeks ? ?Discharge Planning: Hospice facility  ? ?  ? ?Primary Diagnoses: ?Present on Admission: ? Encephalopathy ? ? ?I have reviewed the medical record, interviewed the patient and family, and examined the patient. The following aspects are pertinent. ? ?Past Medical History:  ?Diagnosis Date  ? Alcohol use 02/13/2020  ? Blood clotting disorder ([place]) 07/06/2016  ? Cancer [facility])   ? Cholelithiasis 02/13/2020  ? Congestive heart failure (CHF) ([place]) 12/09/2020  ? Dilated cardiomyopathy ([place]) 12/09/2020  ? Elevated transaminase level 02/13/2020  ? Goals of care, counseling/discussion 09/10/2021  ? Pancreatic adenocarcinoma ([place]) 03/31/2020  ? Tobacco abuse 02/13/2020  ? Total bilirubin, elevated 02/13/2020  ? ?Social History  ? ?Socioeconomic History  ? Marital status: Single  ?  Spouse name: Not on file  ? Number of children: 5  ? Years of education: Not on file  ? Highest education level: Not on file  ?Occupational History  ? Not on file  ?Tobacco Use  ? Smoking status: Every Day  ?  Packs/day: 0.00  ?  Years: 25.00  ?  Pack years: 0.00  ?  Types: Cigarettes, Cigars  ? Smokeless tobacco: Never  ? Tobacco comments:  ?  2 cigs per day per pt  ?Substance and Sexual Activity  ? Alcohol use: Yes  ?  Comment: occ  ? Drug use: Yes  ?  Types: Marijuana  ? Sexual activity: Not on file  ?Other Topics Concern  ? Not on file  ?Social History Narrative  ? Not on file  ? ?Social Determinants of Health  ? ?Financial Resource Strain: Not on file  ?Food Insecurity: Not on file  ?Transportation Needs: Not on file  ?Physical Activity: Not on file  ?Stress: Not on file  ?Social Connections: Not on file  ? ?Family History  ?Problem Relation Age of Onset  ? Heart disease Neg Hx   ? Diabetes Neg Hx   ? ?Scheduled Meds: ?  Chlorhexidine Gluconate Cloth  6 each Topical Daily  ? enoxaparin (LOVENOX) injection  40 mg Subcutaneous Daily  ? mouth rinse  15 mL Mouth Rinse BID  ? pantoprazole sodium  40 mg Per Tube Daily  ? ?Continuous Infusions: ?  ceFAZolin (ANCEF) IV    ? dextrose 10 % and 0.45 % NaCl 50 mL/hr at 12/01/21 1200  ? ?PRN Meds:.docusate sodium, etomidate, fentaNYL, fentaNYL, oxyCODONE, polyethylene glycol ?Allergies  ?Allergen Reactions  ? Irinotecan Other (See Comments) and Shortness Of Breath  ?  Tachycardia, hypertension, respiratory distress with productive cough; (onset of symptoms was 30 minutes after oxaliplatin infusion and 30 minutes into the irinotecan infusion) cycle 7 of 8,  ? ?Review of Systems  ?Unable to perform ROS: Other  - patient declined ? ?Physical Exam ?Constitutional:   ?   General: He is not in acute distress. ?   Comments: cachectic  ?Pulmonary:  ?   Effort: Pulmonary effort is normal.  ?Skin: ?   General: Skin is warm and dry.  ?Neurological:  ?  Mental Status: He is alert and oriented to person, place, and time.  ? ? ?Vital Signs: BP 104/79 (BP Location: Right Arm)   Pulse (!) 102   Temp 99 ?F (37.2 ?C) (Bladder)   Resp (!) 25   Ht 6' 2"  (1.88 m)   Wt 58.7 kg   SpO2 100%   BMI 16.62 kg/m?  ?Pain Scale: 0-10 ?  ?Pain Score: 4  ? ? ?SpO2: SpO2: 100 % ?O2 Device:SpO2: 100 % ?O2 Flow Rate: .O2 Flow Rate (L/min): 4 L/min ? ?IO: Intake/output summary:  ?Intake/Output Summary (Last 24 hours) at 12/01/2021 1523 ?Last data filed at 12/01/2021 1200 ?Gross per 24 hour  ?Intake 2000.82 ml  ?Output 700 ml  ?Net 1300.82 ml  ? ? ?LBM: Last BM Date : 11/30/21 ?Baseline Weight: Weight: 68 kg ?Most recent weight: Weight: 58.7 kg     ?Palliative Assessment/Data: PPS 20% ? ? ? ? ?Juel Burrow, DNP, AGNP-C ?Palliative Medicine Team ?[phone] ?Pager: [phone] ? ?

## 2021-12-01 NOTE — TOC Progression Note (Addendum)
Transition of Care (TOC) - Progression Note  ? ? ?Patient Details  ?Name: Brandon Schmitt ?MRN: [id number] ?Date of Birth: Oct 25, 1960 ? ?Transition of Care (TOC) CM/SW Contact  ?Angelita Ingles, RN ?Phone Number:[phone] ? ?12/01/2021, 2:56 PM ? ?Clinical Narrative:    ?TOC followed up with family to determine families plan for patients disposition needs. Daughter and son at bedside. Daughter (Abagail) confirmed that family will be going Hospice route and formal meeting is schedules with inpatient palliative care team in the morning at 0900. Daughter states that patient wants [facility] but daughter wants to tour facility first before making a final decision. Daughter is ok with CM making [facility] aware of pending decision. CM spoke with Rayfield Citizen at [facility] and made her aware of the families pending decision. TOC will continue to follow.  ? ? ?  ?  ? ?Expected Discharge Plan and Services ?  ?  ?  ?  ?  ?                ?  ?  ?  ?  ?  ?  ?  ?  ?  ?  ? ? ?Social Determinants of Health (SDOH) Interventions ?  ? ?Readmission Risk Interventions ?No flowsheet data found. ? ?

## 2021-12-01 NOTE — Progress Notes (Signed)
PT Cancellation Note ? ?Patient Details ?Name: Brandon Schmitt ?MRN: [id number] ?DOB: 10-16-1960 ? ? ?Cancelled Treatment:    Reason Eval/Treat Not Completed: (P) Other (comment) (Palliative consult imminent) Pt and family awaiting Palliative for GoC discussion (hospice vs rehab). PT will follow back tomorrow for Evaluation if appropriate. ? ?Nikki Glanzer B. Migdalia Dk PT, DPT ?Acute Rehabilitation Services ?Pager [phone] ?Office [phone] ? ? ? ?[place] ?12/01/2021, 2:52 PM ? ? ?

## 2021-12-01 NOTE — Progress Notes (Signed)
OT Cancellation Note ? ?Patient Details ?Name: Brandon Schmitt ?MRN: [id number] ?DOB: 12-28-1960 ? ? ?Cancelled Treatment:    Reason Eval/Treat Not Completed: Other (comment) Multiple family members at bedside in serious discussion with pt. Noted in chart, palliative care consult pending to establish [place] (hospice vs rehab). Will follow up for OT eval as appropriate. ? ?Layla Maw ?12/01/2021, 2:05 PM ?

## 2021-12-01 NOTE — Progress Notes (Signed)
Echocardiogram ?2D Echocardiogram has been performed. ? ?Jefferey Pica ?12/01/2021, 11:46 AM ?

## 2021-12-01 NOTE — Progress Notes (Signed)
PROGRESS NOTE    Brandon Schmitt  RXV:[id number] DOB: Feb 17, 1961 DOA: 11/28/2021 PCP: Mackie Pai, PA-C   Brief Narrative:  Patient is 61 year old Caucasian male past medical history of smoking pancreatic cancer 2 years ago, history of DVT 10 years ago, not on anticoagulation who presented with generalized weakness then passing out.  Reportedly he was supposed to get an MRI he lost follow-up for his pancreatic cancer and currently not on any treatment.  Reportedly he has been declining in health recently.  Patient's had blood sugar of 128 by EMS but when he got here his blood sugar was undetectable and his GCS was 3 so he had to be intubated he was given D50 with no improvement.  He was admitted under PCCM.  Eventually was extubated on 11/30/2021 and made DNR and hospice/palliative care was consulted after discussing with the family and the patient.  Transferred to [facility] 12/01/2021.  Assessment & Plan:   Principal Problem:   Encephalopathy Active Problems:   Pressure injury of skin   Protein-calorie malnutrition, severe  Acute hypoxic respiratory failure possibly secondary to acute on chronic systolic congestive heart failure: Chest x-ray upon presentation shows vascular congestion.  His latest echo was done in February 2022 which showed 25 to 30% ejection fraction.  Needed intubation and eventually extubated on 11/30/2021.  Currently doing well on 4 L of oxygen.  Will wean as able to.  We will repeat chest x-ray, if vascular congestion, will start him on Lasix.  We will repeat echo due to bacteremia.  Septic shock/Klebsiella pneumonia bacteremia: Initially patient needed Levophed in the ICU.  He is off of that and his blood pressure is much better now.  Blood cultures growing Klebsiella pneumonia.  Sensitive to cephalosporins.  He is on Rocephin which I will continue.  Elevated troponin: 169> 192.  EKG with no acute ST-T wave changes.  Echo ordered for looking into wall motion  abnormality.  Acute toxic encephalopathy: Likely secondary to septic shock.  Currently fully alert and oriented.  Underlying cause is being treated.  Hypoglycemia: Patient is on D10 half-normal saline at 125 cc/h.  Blood sugar 127 this morning.  Will advance diet from clear liquid diet to soft diet.  Reduce frequency of the fluid to 50 cc/h monitor blood sugar every 4 hours and if stable, will consider discontinuing IV fluids later today.  Checking C-peptide.  Likely has high insulin production due to pancreatic cancer.  Metastatic pancreatic cancer/elevated LFTs/GOC: His cancer seems to be very advanced.  There is interval disease progression with enlargement of the primary mass which is now resulting in occlusion of the superior mesenteric vein and developing necrotic retroperitoneal and treatment with the and progression of hepatic metastatic disease with some signs of partial obstruction of the first part of the duodenum.  Likely patient has no abdominal pain and no nausea.  He is tolerating clear liquid diet.  LFTs keep getting worse due to metastasis.  Unfortunately the prognosis appears to be very poor.  Patient himself chose to be DNR yesterday.  His fiance was at the bedside and we discussed CODE STATUS once again with the patient and patient confirmed to be DNR and also confirmed that he would like hospice.  Palliative care was consulted.  Waiting for them to see him.  Anemia of chronic disease: Hemoglobin stable around 10.  DVT prophylaxis: enoxaparin (LOVENOX) injection 40 mg Start: 11/29/21 1000 SCDs Start: 11/28/21 2343   Code Status: DNR  Family Communication: Fianc present at bedside.  Plan of care discussed with patient in length and he/she verbalized understanding and agreed with it.  Status is: Inpatient Remains inpatient appropriate because: Needs to be seen by palliative care to finalize further plan of care.  Estimated body mass index is 16.62 kg/m as calculated from the  following:   Height as of this encounter: 6\' 2"  (1.88 m).   Weight as of this encounter: 58.7 kg.  Pressure Injury 11/29/21 Coccyx Medial Stage 2 -  Partial thickness loss of dermis presenting as a shallow open injury with a red, pink wound bed without slough. (Active)  11/29/21 0200  Location: Coccyx  Location Orientation: Medial  Staging: Stage 2 -  Partial thickness loss of dermis presenting as a shallow open injury with a red, pink wound bed without slough.  Wound Description (Comments):   Present on Admission: Yes   Nutritional Assessment: Body mass index is 16.62 kg/m.Marland Kitchen Seen by dietician.  I agree with the assessment and plan as outlined below: Nutrition Status: Nutrition Problem: Severe Malnutrition Etiology: chronic illness (pancreatic cancer) Signs/Symptoms: severe muscle depletion, severe fat depletion Interventions: Refer to RD note for recommendations  . Skin Assessment: I have examined the patient's skin and I agree with the wound assessment as performed by the wound care RN as outlined below: Pressure Injury 11/29/21 Coccyx Medial Stage 2 -  Partial thickness loss of dermis presenting as a shallow open injury with a red, pink wound bed without slough. (Active)  11/29/21 0200  Location: Coccyx  Location Orientation: Medial  Staging: Stage 2 -  Partial thickness loss of dermis presenting as a shallow open injury with a red, pink wound bed without slough.  Wound Description (Comments):   Present on Admission: Yes    Consultants:  Palliative care  Procedures:  Intubation  Antimicrobials:  Anti-infectives (From admission, onward)    Start     Dose/Rate Route Frequency Ordered Stop   11/29/21 1200  vancomycin (VANCOCIN) IVPB 1000 mg/200 mL premix  Status:  Discontinued       See Hyperspace for full Linked Orders Report.   1,000 mg 200 mL/hr over 60 Minutes Intravenous Every 12 hours 11/28/21 2349 11/29/21 1117   11/29/21 0000  cefTRIAXone (ROCEPHIN) 2 g in  sodium chloride 0.9 % 100 mL IVPB        2 g 200 mL/hr over 30 Minutes Intravenous Every 24 hours 11/28/21 2347     11/29/21 0000  azithromycin (ZITHROMAX) 500 mg in sodium chloride 0.9 % 250 mL IVPB  Status:  Discontinued        500 mg 250 mL/hr over 60 Minutes Intravenous Every 24 hours 11/28/21 2347 11/30/21 0913   11/29/21 0000  vancomycin (VANCOREADY) IVPB 1500 mg/300 mL       See Hyperspace for full Linked Orders Report.   1,500 mg 150 mL/hr over 120 Minutes Intravenous  Once 11/28/21 2349 11/29/21 0645   11/28/21 2330  ceFEPIme (MAXIPIME) 2 g in sodium chloride 0.9 % 100 mL IVPB  Status:  Discontinued        2 g 200 mL/hr over 30 Minutes Intravenous Every 8 hours 11/28/21 2316 11/28/21 2348   11/28/21 2330  metroNIDAZOLE (FLAGYL) IVPB 500 mg  Status:  Discontinued        500 mg 100 mL/hr over 60 Minutes Intravenous Every 12 hours 11/28/21 2316 11/28/21 2344         Subjective: Seen and examined.  Only complaint he has is back pain due to uncomfortable bed.  He says that he could not sleep last night due to the pain.  I am not sure how much he understands the gravity of situation.  He is willing to accept hospice but at the same time he is expecting to go to rehab.  I did explain to him that it might not be possible to achieve both things.  Palliative care will discuss with him further.  Objective: Vitals:   12/01/21 0500 12/01/21 0600 12/01/21 0700 12/01/21 0800  BP: 102/78 105/80 102/85 104/83  Pulse: (!) 104 (!) 106 (!) 103 98  Resp: 13 17 15 12   Temp: 98.6 F (37 C) 98.6 F (37 C) 98.6 F (37 C) 98.2 F (36.8 C)  TempSrc:    Bladder  SpO2: 100% 100% 100% 100%  Weight: 58.7 kg     Height:        Intake/Output Summary (Last 24 hours) at 12/01/2021 1001 Last data filed at 12/01/2021 0600 Gross per 24 hour  Intake 1903.48 ml  Output 850 ml  Net 1053.48 ml   Filed Weights   11/28/21 2315 11/29/21 0142 12/01/21 0500  Weight: 68 kg 58.7 kg 58.7 kg     Examination:  General exam: Appears calm and comfortable, cachectic Respiratory system: Clear to auscultation. Respiratory effort normal. Cardiovascular system: S1 & S2 heard, RRR. No JVD, murmurs, rubs, gallops or clicks. No pedal edema. Gastrointestinal system: Abdomen is nondistended, soft and nontender. No organomegaly or masses felt. Normal bowel sounds heard. Central nervous system: Alert and oriented. No focal neurological deficits. Extremities: Symmetric 5 x 5 power. Skin: No rashes, lesions or ulcers Psychiatry: Judgement and insight appear poor   Data Reviewed: I have personally reviewed following labs and imaging studies  CBC: Recent Labs  Lab 11/28/21 2218 11/28/21 2227 11/29/21 0053 11/29/21 0246 11/29/21 0344 11/30/21 0055 12/01/21 0105  WBC 7.3  --   --  6.7  --  11.3* 5.9  NEUTROABS 6.5  --   --   --   --  10.4* 5.0  HGB 9.7*   < > 9.2* 9.0* 9.2* 9.9* 10.9*  HCT 29.2*   < > 27.0* 27.6* 27.0* 30.4* 33.6*  MCV 88.0  --   --  89.3  --  90.5 89.6  PLT 85*  --   --  78*  --  72* 55*   < > = values in this interval not displayed.   Basic Metabolic Panel: Recent Labs  Lab 11/28/21 2218 11/28/21 2227 11/28/21 2351 11/29/21 0053 11/29/21 0246 11/29/21 0344 11/29/21 1247 11/30/21 0055 12/01/21 0105  NA 143 144   < > 142  --  143 142 142 142  K 2.9* 2.8*   < > 2.6*  --  3.0* 3.8 3.4* 4.3  CL 97* 93*  --   --   --   --  96* 95* 97*  CO2 38*  --   --   --   --   --  38* 40* 37*  GLUCOSE <20* <20*  --   --   --   --  149* 210* 104*  BUN 53* 48*  --   --   --   --  51* 45* 45*  CREATININE 0.56* 0.70  --   --  0.62  --  0.63 0.71 0.62  CALCIUM 6.9*  --   --   --   --   --  7.5* 7.5* 7.8*  MG  --   --   --   --   --   --  2.2 2.1 2.0  PHOS  --   --   --   --   --   --  3.3  --  2.7   < > = values in this interval not displayed.   GFR: Estimated Creatinine Clearance: 82.5 mL/min (by C-G formula based on SCr of 0.62 mg/dL). Liver Function Tests: Recent  Labs  Lab 11/28/21 2218 11/29/21 1247 11/30/21 0055 12/01/21 0105  AST 125* 197* 145* 394*  ALT 92* 128* 125* 200*  ALKPHOS 109 153* 150* 207*  BILITOT 2.4* 2.7* 2.1* 3.4*  PROT 4.4* 4.6* 4.7* 4.9*  ALBUMIN 2.1* 2.3* 2.3* 2.2*   No results for input(s): LIPASE, AMYLASE in the last 168 hours. Recent Labs  Lab 11/29/21 0708  AMMONIA 63*   Coagulation Profile: Recent Labs  Lab 11/29/21 0246  INR 2.6*   Cardiac Enzymes: No results for input(s): CKTOTAL, CKMB, CKMBINDEX, TROPONINI in the last 168 hours. BNP (last 3 results) No results for input(s): PROBNP in the last 8760 hours. HbA1C: No results for input(s): HGBA1C in the last 72 hours. CBG: Recent Labs  Lab 11/30/21 2034 11/30/21 2253 12/01/21 0104 12/01/21 0334 12/01/21 0720  GLUCAP 100* 112* 110* 123* 127*   Lipid Profile: No results for input(s): CHOL, HDL, LDLCALC, TRIG, CHOLHDL, LDLDIRECT in the last 72 hours. Thyroid Function Tests: No results for input(s): TSH, T4TOTAL, FREET4, T3FREE, THYROIDAB in the last 72 hours. Anemia Panel: No results for input(s): VITAMINB12, FOLATE, FERRITIN, TIBC, IRON, RETICCTPCT in the last 72 hours. Sepsis Labs: Recent Labs  Lab 11/28/21 2218 11/29/21 0246  LATICACIDVEN 0.5 1.5    Recent Results (from the past 240 hour(s))  Resp Panel by RT-PCR (Flu A&B, Covid) Nasopharyngeal Swab     Status: None   Collection Time: 11/28/21  9:44 PM   Specimen: Nasopharyngeal Swab; Nasopharyngeal(NP) swabs in vial transport medium  Result Value Ref Range Status   SARS Coronavirus 2 by RT PCR NEGATIVE NEGATIVE Final    Comment: (NOTE) SARS-CoV-2 target nucleic acids are NOT DETECTED.  The SARS-CoV-2 RNA is generally detectable in upper respiratory specimens during the acute phase of infection. The lowest concentration of SARS-CoV-2 viral copies this assay can detect is 138 copies/mL. A negative result does not preclude SARS-Cov-2 infection and should not be used as the sole basis  for treatment or other patient management decisions. A negative result may occur with  improper specimen collection/handling, submission of specimen other than nasopharyngeal swab, presence of viral mutation(s) within the areas targeted by this assay, and inadequate number of viral copies(<138 copies/mL). A negative result must be combined with clinical observations, patient history, and epidemiological information. The expected result is Negative.  Fact Sheet for Patients:  EntrepreneurPulse.com.au  Fact Sheet for Healthcare Providers:  IncredibleEmployment.be  This test is no t yet approved or cleared by the [place] FDA and  has been authorized for detection and/or diagnosis of SARS-CoV-2 by FDA under an Emergency Use Authorization (EUA). This EUA will remain  in effect (meaning this test can be used) for the duration of the COVID-19 declaration under Section 564(b)(1) of the Act, 21 U.S.C.section 360bbb-3(b)(1), unless the authorization is terminated  or revoked sooner.       Influenza A by PCR NEGATIVE NEGATIVE Final   Influenza B by PCR NEGATIVE NEGATIVE Final    Comment: (NOTE) The Xpert Xpress SARS-CoV-2/FLU/RSV plus assay is intended as an aid in the diagnosis of influenza from Nasopharyngeal swab specimens and should not be used as a sole  basis for treatment. Nasal washings and aspirates are unacceptable for Xpert Xpress SARS-CoV-2/FLU/RSV testing.  Fact Sheet for Patients: EntrepreneurPulse.com.au  Fact Sheet for Healthcare Providers: IncredibleEmployment.be  This test is not yet approved or cleared by the [place] FDA and has been authorized for detection and/or diagnosis of SARS-CoV-2 by FDA under an Emergency Use Authorization (EUA). This EUA will remain in effect (meaning this test can be used) for the duration of the COVID-19 declaration under Section 564(b)(1) of the Act, 21  U.S.C. section 360bbb-3(b)(1), unless the authorization is terminated or revoked.  Performed at [facility], [place] [address] [place], [place] [address]   Urine Culture     Status: None   Collection Time: 11/28/21 11:04 PM   Specimen: In/Out Cath Urine  Result Value Ref Range Status   Specimen Description IN/OUT CATH URINE  Final   Special Requests STERILE CUP  Final   Culture   Final    NO GROWTH Performed at [facility], [place] [address] [place], [place] [address]    Report Status 11/30/2021 FINAL  Final  Blood Culture (routine x 2)     Status: Abnormal (Preliminary result)   Collection Time: 11/28/21 11:19 PM   Specimen: BLOOD  Result Value Ref Range Status   Specimen Description BLOOD SITE NOT SPECIFIED  Final   Special Requests   Final    BOTTLES DRAWN AEROBIC AND ANAEROBIC Blood Culture adequate volume   Culture  Setup Time   Final    GRAM NEGATIVE RODS AEROBIC BOTTLE ONLY CRITICAL RESULT CALLED TO, READ BACK BY AND VERIFIED WITH: PHARMD KAREN AMEND 11/29/21@21 :49 BY TW IN BOTH AEROBIC AND ANAEROBIC BOTTLES Performed at [facility], [place] [address], [place], [place] [address]    Culture KLEBSIELLA PNEUMONIAE (A)  Final   Report Status PENDING  Incomplete   Organism ID, Bacteria KLEBSIELLA PNEUMONIAE  Final      Susceptibility   Klebsiella pneumoniae - MIC*    AMPICILLIN >=32 RESISTANT Resistant     CEFAZOLIN <=4 SENSITIVE Sensitive     CEFEPIME <=0.12 SENSITIVE Sensitive     CEFTAZIDIME <=1 SENSITIVE Sensitive     CEFTRIAXONE <=0.25 SENSITIVE Sensitive     CIPROFLOXACIN <=0.25 SENSITIVE Sensitive     GENTAMICIN <=1 SENSITIVE Sensitive     IMIPENEM <=0.25 SENSITIVE Sensitive     TRIMETH/SULFA <=20 SENSITIVE Sensitive     AMPICILLIN/SULBACTAM 4 SENSITIVE Sensitive     PIP/TAZO <=4 SENSITIVE Sensitive     * KLEBSIELLA PNEUMONIAE  Blood Culture ID Panel (Reflexed)     Status: Abnormal   Collection Time: 11/28/21 11:19 PM  Result Value Ref Range  Status   Enterococcus faecalis NOT DETECTED NOT DETECTED Final   Enterococcus Faecium NOT DETECTED NOT DETECTED Final   Listeria monocytogenes NOT DETECTED NOT DETECTED Final   Staphylococcus species NOT DETECTED NOT DETECTED Final   Staphylococcus aureus (BCID) NOT DETECTED NOT DETECTED Final   Staphylococcus epidermidis NOT DETECTED NOT DETECTED Final   Staphylococcus lugdunensis NOT DETECTED NOT DETECTED Final   Streptococcus species NOT DETECTED NOT DETECTED Final   Streptococcus agalactiae NOT DETECTED NOT DETECTED Final   Streptococcus pneumoniae NOT DETECTED NOT DETECTED Final   Streptococcus pyogenes NOT DETECTED NOT DETECTED Final   A.calcoaceticus-baumannii NOT DETECTED NOT DETECTED Final   Bacteroides fragilis NOT DETECTED NOT DETECTED Final   Enterobacterales DETECTED (A) NOT DETECTED Final    Comment: Enterobacterales represent a large order of gram negative bacteria, not a single  organism. CRITICAL RESULT CALLED TO, READ BACK BY AND VERIFIED WITH: PHARMD KAREN AMEND 11/29/21@21 :49 BY TW    Enterobacter cloacae complex NOT DETECTED NOT DETECTED Final   Escherichia coli NOT DETECTED NOT DETECTED Final   Klebsiella aerogenes NOT DETECTED NOT DETECTED Final   Klebsiella oxytoca NOT DETECTED NOT DETECTED Final   Klebsiella pneumoniae DETECTED (A) NOT DETECTED Final    Comment: CRITICAL RESULT CALLED TO, READ BACK BY AND VERIFIED WITH: PHARMD KAREN AMEND 11/29/21@21 :49 BY TW    Proteus species NOT DETECTED NOT DETECTED Final   Salmonella species NOT DETECTED NOT DETECTED Final   Serratia marcescens NOT DETECTED NOT DETECTED Final   Haemophilus influenzae NOT DETECTED NOT DETECTED Final   Neisseria meningitidis NOT DETECTED NOT DETECTED Final   Pseudomonas aeruginosa NOT DETECTED NOT DETECTED Final   Stenotrophomonas maltophilia NOT DETECTED NOT DETECTED Final   Candida albicans NOT DETECTED NOT DETECTED Final   Candida auris NOT DETECTED NOT DETECTED Final   Candida glabrata  NOT DETECTED NOT DETECTED Final   Candida krusei NOT DETECTED NOT DETECTED Final   Candida parapsilosis NOT DETECTED NOT DETECTED Final   Candida tropicalis NOT DETECTED NOT DETECTED Final   Cryptococcus neoformans/gattii NOT DETECTED NOT DETECTED Final   CTX-M ESBL NOT DETECTED NOT DETECTED Final   Carbapenem resistance IMP NOT DETECTED NOT DETECTED Final   Carbapenem resistance KPC NOT DETECTED NOT DETECTED Final   Carbapenem resistance NDM NOT DETECTED NOT DETECTED Final   Carbapenem resist OXA 48 LIKE NOT DETECTED NOT DETECTED Final   Carbapenem resistance VIM NOT DETECTED NOT DETECTED Final    Comment: Performed at [facility], [address] [place], [place] [address]  MRSA Next Gen by PCR, Nasal     Status: None   Collection Time: 11/29/21  1:49 AM   Specimen: Nasal Mucosa; Nasal Swab  Result Value Ref Range Status   MRSA by PCR Next Gen NOT DETECTED NOT DETECTED Final    Comment: (NOTE) The GeneXpert MRSA Assay (FDA approved for NASAL specimens only), is one component of a comprehensive MRSA colonization surveillance program. It is not intended to diagnose MRSA infection nor to guide or monitor treatment for MRSA infections. Test performance is not FDA approved in patients less than 37 years old. Performed at [facility], [place] [address], [place], [place] [address]   Blood Culture (routine x 2)     Status: None (Preliminary result)   Collection Time: 11/29/21  2:47 AM   Specimen: BLOOD LEFT HAND  Result Value Ref Range Status   Specimen Description BLOOD LEFT HAND  Final   Special Requests   Final    BOTTLES DRAWN AEROBIC AND ANAEROBIC Blood Culture adequate volume   Culture   Final    NO GROWTH 1 DAY Performed at [facility], [place] [address] [place],  [address]    Report Status PENDING  Incomplete     Radiology Studies: No results found.  Scheduled Meds:  Chlorhexidine Gluconate Cloth  6 each Topical Daily   enoxaparin (LOVENOX)  injection  40 mg Subcutaneous Daily   mouth rinse  15 mL Mouth Rinse BID   pantoprazole sodium  40 mg Per Tube Daily   Continuous Infusions:  cefTRIAXone (ROCEPHIN)  IV Stopped (12/01/21 0125)   dextrose 10 % and 0.45 % NaCl 50 mL/hr at 12/01/21 0942     LOS: 3 days   Time spent: 39 minutes  Darliss Cheney, MD [facility]  12/01/2021,  10:01 AM  Please page via [place] and do not message via secure chat for urgent patient care matters. Secure chat can be used for non urgent patient care matters.  How to contact the [facility] Attending or Consulting provider [place] or covering provider during after hours [place], for this patient?  Check the care team in [facility] and look for a) attending/consulting [facility] provider listed and b) the [facility] team listed. Page or secure chat [phone]. Log into [email or web address] and use [place]'s universal password to access. If you do not have the password, please contact the hospital operator. Locate the [facility] provider you are looking for under [facility] and page to a number that you can be directly reached. If you still have difficulty reaching the provider, please page the [facility] (Director on Call) for the Hospitalists listed on amion for assistance.

## 2021-12-01 NOTE — Progress Notes (Signed)
?  X-cover Note: ?RN reports pt unable to take po meds without choking. Have ordered IV fentanyl prn. ? ? ?Kristopher Oppenheim, DO ?[facility] ? ?

## 2021-12-02 DIAGNOSIS — C259 Malignant neoplasm of pancreas, unspecified: Secondary | ICD-10-CM | POA: Diagnosis not present

## 2021-12-02 DIAGNOSIS — G9340 Encephalopathy, unspecified: Secondary | ICD-10-CM | POA: Diagnosis not present

## 2021-12-02 LAB — CBC WITH DIFFERENTIAL/PLATELET
Abs Immature Granulocytes: 0.09 10*3/uL — ABNORMAL HIGH (ref 0.00–0.07)
Basophils Absolute: 0 10*3/uL (ref 0.0–0.1)
Basophils Relative: 0 %
Eosinophils Absolute: 0 10*3/uL (ref 0.0–0.5)
Eosinophils Relative: 0 %
HCT: 29.5 % — ABNORMAL LOW (ref 39.0–52.0)
Hemoglobin: 9.9 g/dL — ABNORMAL LOW (ref 13.0–17.0)
Immature Granulocytes: 1 %
Lymphocytes Relative: 6 %
Lymphs Abs: 0.5 10*3/uL — ABNORMAL LOW (ref 0.7–4.0)
MCH: 30 pg (ref 26.0–34.0)
MCHC: 33.6 g/dL (ref 30.0–36.0)
MCV: 89.4 fL (ref 80.0–100.0)
Monocytes Absolute: 0.2 10*3/uL (ref 0.1–1.0)
Monocytes Relative: 2 %
Neutro Abs: 7.7 10*3/uL (ref 1.7–7.7)
Neutrophils Relative %: 91 %
Platelets: 42 10*3/uL — ABNORMAL LOW (ref 150–400)
RBC: 3.3 MIL/uL — ABNORMAL LOW (ref 4.22–5.81)
RDW: 15.6 % — ABNORMAL HIGH (ref 11.5–15.5)
WBC: 8.4 10*3/uL (ref 4.0–10.5)
nRBC: 0 % (ref 0.0–0.2)

## 2021-12-02 LAB — CULTURE, BLOOD (ROUTINE X 2): Special Requests: ADEQUATE

## 2021-12-02 LAB — COMPREHENSIVE METABOLIC PANEL
ALT: 188 U/L — ABNORMAL HIGH (ref 0–44)
AST: 229 U/L — ABNORMAL HIGH (ref 15–41)
Albumin: 2.3 g/dL — ABNORMAL LOW (ref 3.5–5.0)
Alkaline Phosphatase: 214 U/L — ABNORMAL HIGH (ref 38–126)
Anion gap: 9 (ref 5–15)
BUN: 60 mg/dL — ABNORMAL HIGH (ref 6–20)
CO2: 34 mmol/L — ABNORMAL HIGH (ref 22–32)
Calcium: 7.9 mg/dL — ABNORMAL LOW (ref 8.9–10.3)
Chloride: 100 mmol/L (ref 98–111)
Creatinine, Ser: 0.79 mg/dL (ref 0.61–1.24)
GFR, Estimated: >60 mL/min (ref >60)
Glucose, Bld: 79 mg/dL (ref 70–99)
Potassium: 4.5 mmol/L (ref 3.5–5.1)
Sodium: 143 mmol/L (ref 135–145)
Total Bilirubin: 2.9 mg/dL — ABNORMAL HIGH (ref 0.3–1.2)
Total Protein: 4.8 g/dL — ABNORMAL LOW (ref 6.5–8.1)

## 2021-12-02 LAB — C-PEPTIDE: C-Peptide: <0.1 ng/mL — ABNORMAL LOW (ref 1.1–4.4)

## 2021-12-02 LAB — GLUCOSE, CAPILLARY
Glucose-Capillary: 147 mg/dL — ABNORMAL HIGH (ref 70–99)
Glucose-Capillary: 53 mg/dL — ABNORMAL LOW (ref 70–99)

## 2021-12-02 LAB — MAGNESIUM: Magnesium: 2.1 mg/dL (ref 1.7–2.4)

## 2021-12-02 MED ORDER — GLYCOPYRROLATE 0.2 MG/ML IJ SOLN: 0.2000 mg | INTRAMUSCULAR | Status: DC | PRN

## 2021-12-02 MED ORDER — DEXTROSE 50 % IV SOLN
INTRAVENOUS | Status: AC
  Administered 2021-12-02: 50 mL
  Filled 2021-12-02: qty 50

## 2021-12-02 MED ORDER — GLYCOPYRROLATE 1 MG PO TABS
1.0000 mg | ORAL_TABLET | ORAL | Status: DC | PRN
  Filled 2021-12-02: qty 1

## 2021-12-02 MED ORDER — LORAZEPAM 1 MG PO TABS: 1.0000 mg | ORAL_TABLET | ORAL | Status: DC | PRN

## 2021-12-02 MED ORDER — POLYVINYL ALCOHOL 1.4 % OP SOLN
1.0000 [drp] | Freq: Four times a day (QID) | OPHTHALMIC | Status: DC | PRN
  Filled 2021-12-02: qty 15

## 2021-12-02 MED ORDER — LORAZEPAM 2 MG/ML IJ SOLN: 1.0000 mg | INTRAMUSCULAR | Status: DC | PRN

## 2021-12-02 MED ORDER — HALOPERIDOL LACTATE 2 MG/ML PO CONC
2.0000 mg | ORAL | Status: DC | PRN
  Filled 2021-12-02: qty 1

## 2021-12-02 MED ORDER — LORAZEPAM 2 MG/ML PO CONC: 1.0000 mg | ORAL | Status: DC | PRN

## 2021-12-02 MED ORDER — HALOPERIDOL LACTATE 5 MG/ML IJ SOLN: 2.0000 mg | INTRAMUSCULAR | Status: DC | PRN

## 2021-12-02 MED ORDER — BIOTENE DRY MOUTH MT LIQD
15.0000 mL | Freq: Three times a day (TID) | OROMUCOSAL | Status: DC
  Administered 2021-12-02: 15 mL via TOPICAL

## 2021-12-02 MED ORDER — MORPHINE SULFATE (PF) 2 MG/ML IV SOLN
2.0000 mg | INTRAVENOUS | Status: DC | PRN
  Administered 2021-12-02: 2 mg via INTRAVENOUS
  Filled 2021-12-02: qty 1

## 2021-12-02 MED ORDER — GLYCOPYRROLATE 0.2 MG/ML IJ SOLN
0.2000 mg | INTRAMUSCULAR | Status: DC | PRN
  Administered 2021-12-02: 0.2 mg via INTRAVENOUS
  Filled 2021-12-02: qty 1

## 2021-12-02 MED ORDER — HALOPERIDOL 1 MG PO TABS
2.0000 mg | ORAL_TABLET | ORAL | Status: DC | PRN
  Filled 2021-12-02: qty 2

## 2021-12-03 ENCOUNTER — Telehealth: Payer: Self-pay

## 2021-12-03 NOTE — Telephone Encounter (Signed)
[facility] sent a letter stating patient died Dec 06, 2021 at 7:05 pm  ?

## 2021-12-04 LAB — CULTURE, BLOOD (ROUTINE X 2)
Culture: NO GROWTH
Special Requests: ADEQUATE

## 2021-12-06 ENCOUNTER — Ambulatory Visit: Payer: Managed Care, Other (non HMO) | Admitting: Cardiology

## 2022-01-01 NOTE — TOC Progression Note (Signed)
Transition of Care (TOC) - Progression Note  ? ? ?Patient Details  ?Name: Issaiah Seabrooks ?MRN: [id number] ?Date of Birth: May 23, 1961 ? ?Transition of Care (TOC) CM/SW Contact  ?Angelita Ingles, RN ?Phone [phone] ? ?Dec 20, 2021, 8:47 AM ? ?Clinical Narrative:    ?CM followed up with [facility] [place]. Cherie is aware that family has toured and chosen their facility for residential hospice. Cherie will follow up.  ? ? ?  ?  ? ?Expected Discharge Plan and Services ?  ?  ?  ?  ?  ?                ?  ?  ?  ?  ?  ?  ?  ?  ?  ?  ? ? ?Social Determinants of Health (SDOH) Interventions ?  ? ?Readmission Risk Interventions ?No flowsheet data found. ? ?

## 2022-01-01 NOTE — Progress Notes (Signed)
Nutrition Brief Note ? ?Chart reviewed. ?Plans for comfort care and discharge to residential Hospice facility.  ?No nutrition interventions planned at this time.  ?Please consult as needed.  ? ?Lucas Mallow RD, LDN, CNSC ?Please refer to Amion for contact information.                                                       ? ? ?

## 2022-01-01 NOTE — Progress Notes (Signed)
PT Cancellation Note ? ?Patient Details ?Name: Brandon Schmitt ?MRN: [id number] ?DOB: 10-19-1960 ? ? ?Cancelled Treatment:    Reason Eval/Treat Not Completed: Other (comment). Pt to dc to hospice home. Will sign off. ? ? ?[place] ?01-Jan-2022, 8:55 AM ?Suanne Marker PT ?Acute Rehabilitation Services ?Pager [phone] ?Office [phone] ? ?

## 2022-01-01 NOTE — Discharge Summary (Signed)
PatientPhysician Discharge Summary  Brandon Schmitt WGN:[id number] DOB: [birth year] DOA: 11/28/2021  PCP: Esperanza Richters, PA-C  Admit date: 11/28/2021 Discharge date: 2021-12-28 30 Day Unplanned Readmission Risk Score    Flowsheet Row ED to Hosp-Admission (Current) from 11/28/2021 in [facility]  SURGICAL  30 Day Unplanned Readmission Risk Score (%) 21.18 Filed at 2021/12/28 0801       This score is the patient's risk of an unplanned readmission within 30 days of being discharged (0 -100%). The score is based on dignosis, age, lab data, medications, orders, and past utilization.   Low:  0-14.9   Medium: 15-21.9   High: 22-29.9   Extreme: 30 and above          Admitted From: Home Disposition: Hospice facility  Recommendations for Outpatient Follow-up:  Follow up with PCP in 1-2 weeks Please obtain BMP/CBC in one week Please follow up with your PCP on the following pending results: Unresulted Labs (From admission, onward)    None       Discharge Condition: Guarded CODE STATUS: DNR Diet recommendation: As patient pleases  Subjective: Seen and examined this morning.  He was slightly uncomfortable due to pain.  A lot of family members/5 children were at the bedside.  Brief/Interim Summary: Patient is 61 year old Caucasian male past medical history of smoking pancreatic cancer 2 years ago, history of DVT 10 years ago, not on anticoagulation who presented with generalized weakness then passing out.  Reportedly he was supposed to get an MRI he lost follow-up for his pancreatic cancer and currently not on any treatment.  Reportedly he has been declining in health recently.  Patient's had blood sugar of 128 by EMS but when he got here his blood sugar was undetectable and his GCS was 3 so he had to be intubated he was given D50 with no improvement.  He was admitted under PCCM.  Eventually was extubated on 11/30/2021 and made DNR and hospice/palliative care was consulted  after discussing with the family and the patient.  Transferred to [facility] 12/01/2021.  Seen by palliative care yesterday and they had a family meeting today around 9 AM, all the family as well as patient agreed to transition to comfort care and transfer to hospice facility.  A bed has been available at their preferred facility and patient is going to be discharged today.   Septic shock/Klebsiella pneumonia bacteremia: Initially patient needed Levophed in the ICU.  He is off of that and his blood pressure is much better now.  Blood cultures growing Klebsiella pneumonia.  Sensitive to cephalosporins.  He has been on Rocephin but now he is going to be discharged with hospice today.   Elevated troponin: 169> 192.  EKG with no acute ST-T wave changes.  Echo now shows new diagnosis of EF less than 20% and left ventricle has severely decreased function.  Global hypokinesis.  This meets criteria for NSTEMI as well as new acute systolic congestive heart failure.  Patient is now hospice.   Acute toxic encephalopathy: Likely secondary to septic shock.  Currently fully alert and oriented.     Hypoglycemia: Secondary to very low insulin production due to pancreatic cancer.   Metastatic pancreatic cancer/elevated LFTs/GOC: His cancer seems to be very advanced.  There is interval disease progression with enlargement of the primary mass which is now resulting in occlusion of the superior mesenteric vein and developing necrotic retroperitoneal and treatment with the and progression of hepatic metastatic disease with some signs of partial  obstruction of the first part of the duodenum.  Likely patient has no abdominal pain and no nausea.  He is tolerating clear liquid diet.  LFTs keep getting worse due to metastasis.  Unfortunately the prognosis appears to be very poor.  Patient himself chose to be DNR yesterday.  His fiance was at the bedside and we discussed CODE STATUS once again with the patient and patient confirmed to be  DNR and also confirmed that he would like hospice.     Discharge plan was discussed with patient and/or family member and they verbalized understanding and agreed with it.  Discharge Diagnoses:  Principal Problem:   Encephalopathy Active Problems:   Pressure injury of skin   Protein-calorie malnutrition, severe    Discharge Instructions   Allergies as of 12/08/21       Reactions   Irinotecan Other (See Comments), Shortness Of Breath   Tachycardia, hypertension, respiratory distress with productive cough; (onset of symptoms was 30 minutes after oxaliplatin infusion and 30 minutes into the irinotecan infusion) cycle 7 of 8,        Medication List     TAKE these medications    albuterol 108 (90 Base) MCG/ACT inhaler Commonly known as: VENTOLIN HFA Inhale 1-2 puffs into the lungs every 4 (four) hours as needed for wheezing or shortness of breath.   aspirin EC 81 MG tablet Take 325 mg by mouth daily as needed for mild pain. Swallow whole.   Entresto 24-26 MG Generic drug: sacubitril-valsartan TAKE 1 TABLET BY MOUTH TWICE DAILY   famotidine 20 MG tablet Commonly known as: PEPCID Take 1 tablet (20 mg total) by mouth daily. What changed:  when to take this reasons to take this   furosemide 40 MG tablet Commonly known as: LASIX Take 0.5 tablets (20 mg total) by mouth daily as needed. What changed: reasons to take this   hydrOXYzine 10 MG tablet Commonly known as: ATARAX Take 1 tablet (10 mg total) by mouth 3 (three) times daily as needed. What changed: reasons to take this   multivitamin with minerals tablet Take 1 tablet by mouth daily. Unknown strenght   Pancreaze 37000-97300 units Cpep Generic drug: Pancrelipase (Lip-Prot-Amyl) Take 3 capsules by mouth with breakfast, with lunch, and with evening meal. What changed:  when to take this additional instructions   traZODone 50 MG tablet Commonly known as: DESYREL Take 0.5-1 tablets (25-50 mg total) by mouth  at bedtime as needed for sleep.        Allergies  Allergen Reactions   Irinotecan Other (See Comments) and Shortness Of Breath    Tachycardia, hypertension, respiratory distress with productive cough; (onset of symptoms was 30 minutes after oxaliplatin infusion and 30 minutes into the irinotecan infusion) cycle 7 of 8,    Consultations: Palliative care   Procedures/Studies: CT HEAD WO CONTRAST ( )  Result Date: 11/29/2021 CLINICAL DATA:  Normal GB deficit. EXAM: CT HEAD WITHOUT CONTRAST TECHNIQUE: Contiguous axial images were obtained from the base of the skull through the vertex without intravenous contrast. RADIATION DOSE REDUCTION: This exam was performed according to the departmental dose-optimization program which includes automated exposure control, adjustment of the mA and/or kV according to patient size and/or use of iterative reconstruction technique. COMPARISON:  None. FINDINGS: Brain: The ventricles and sulci appropriate size for patient's age. Mild periventricular and deep white matter chronic microvascular ischemic changes noted. There is no acute intracranial hemorrhage. No mass effect or midline shift no extra-axial fluid collection. Vascular: No hyperdense vessel or  unexpected calcification. Skull: Normal. Negative for fracture or focal lesion. Sinuses/Orbits: No acute finding. Other: Partially visualized enteric tube in the oropharynx. IMPRESSION: No acute intracranial pathology. Electronically Signed   By: Elgie Collard M.D.   On: 11/29/2021 00:43   CT Angio Chest PE W/Cm &/Or Wo Cm  Result Date: 11/29/2021 CLINICAL DATA:  Pulmonary embolism (PE) suspected, high prob; Abdominal pain, acute, nonlocalized. Generalized weakness, hypotension, shock. EXAM: CT ANGIOGRAPHY CHEST CT ABDOMEN AND PELVIS WITH CONTRAST TECHNIQUE: Multidetector CT imaging of the chest was performed using the standard protocol during bolus administration of intravenous contrast. Multiplanar CT image  reconstructions and MIPs were obtained to evaluate the vascular anatomy. Multidetector CT imaging of the abdomen and pelvis was performed using the standard protocol during bolus administration of intravenous contrast. RADIATION DOSE REDUCTION: This exam was performed according to the departmental dose-optimization program which includes automated exposure control, adjustment of the mA and/or kV according to patient size and/or use of iterative reconstruction technique. CONTRAST:  85mL OMNIPAQUE IOHEXOL 350 MG/ML SOLN COMPARISON:  CT abdomen 09/29/2020 FINDINGS: CTA CHEST FINDINGS Cardiovascular: There is adequate opacification of the pulmonary arterial tree. No intraluminal filling defect identified to suggest acute pulmonary embolism. The central pulmonary arteries are of normal caliber. Moderate coronary artery calcification. Global cardiac size is within normal limits though left ventricular dilation is noted, progressive since prior examination. No pericardial effusion. The thoracic aorta is of normal caliber. Mild atherosclerotic calcification noted within the aortic arch. Right internal jugular chest port tip noted within the superior vena cava. Mediastinum/Nodes: Endotracheal tube seen 4 cm above the carina. Nasogastric tube extends into the proximal body of the stomach. There is marked body wall wasting and diffuse subcutaneous and mediastinal edema which impairs delineation of the mediastinal structures. Visualized thyroid is unremarkable. Lungs/Pleura: There is interlobular septal thickening, best appreciated within the lung apices, with superimposed ground-glass pulmonary infiltrate most suggestive of interstitial and alveolar pulmonary edema. Upper lobe predominance may relate to supine positioning and pulmonary vascular redistribution. Layering debris noted within the central airways. Mild bibasilar dependent atelectasis. No pneumothorax or pleural effusion. Musculoskeletal: No acute bone abnormality.  No lytic or blastic bone lesion. Review of the MIP images confirms the above findings. CT ABDOMEN and PELVIS FINDINGS Hepatobiliary: Innumerable hepatic metastases are seen throughout both hepatic lobes in keeping with the patient's known metastatic pancreatic cancer. Mild pneumobilia is present. Palliative stenting of the extrahepatic bile duct has been performed with a a metallic biliary stent noted extending into the second portion of the duodenum. Gallbladder is unremarkable. 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 There is atrophy of the pancreatic body and tail again noted. Spleen: Unremarkable Adrenals/Urinary Tract: Adrenal glands are unremarkable. Kidneys are normal, without renal calculi, focal lesion, or hydronephrosis. Bladder is decompressed with a Foley catheter balloon seen within its lumen. Stomach/Bowel: The stomach is mildly distended and fluid-filled. The pancreatic mass abuts an narrows the first portion of the duodenum, best seen on image # 31/5, possibly resulting in some degree of gastric outlet obstruction. The stomach, small bowel, and large bowel are otherwise unremarkable. Mild ascites has developed. Vascular/Lymphatic: Mild aortoiliac atherosclerotic calcification. No aortic aneurysm. Multiple necrotic retroperitoneal lymph nodes have developed, in keeping with developing nodal metastases within the a perirenal region Reproductive: Prostate is unremarkable. Other: There is marked diffuse body wall wasting and subcutaneous edema noted. There  is extensive retroperitoneal edema which limits delineation of the retroperitoneal structures. Musculoskeletal: No acute bone abnormality. No lytic or blastic bone lesion. Review of the MIP images confirms the above findings. IMPRESSION: Interval disease progression with enlargement of  the primary mass within the pancreatic head now resulting in occlusion of the superior mesenteric vein at the portosplenic confluence, developing necrotic retroperitoneal adenopathy, progression of hepatic metastatic disease and possible abutment and partial obstruction of the first portion of the duodenum. Marked body wall wasting with severe anasarca. No pulmonary embolism. Interval development of left ventricular dilation. Diffuse interstitial and ground-glass alveolar pulmonary infiltrate most in keeping with interstitial and alveolar pulmonary edema, possibly cardiogenic in nature. Electronically Signed   By: Helyn Numbers M.D.   On: 11/29/2021 01:04   CT Abdomen Pelvis W Contrast  Result Date: 11/29/2021 CLINICAL DATA:  Pulmonary embolism (PE) suspected, high prob; Abdominal pain, acute, nonlocalized. Generalized weakness, hypotension, shock. EXAM: CT ANGIOGRAPHY CHEST CT ABDOMEN AND PELVIS WITH CONTRAST TECHNIQUE: Multidetector CT imaging of the chest was performed using the standard protocol during bolus administration of intravenous contrast. Multiplanar CT image reconstructions and MIPs were obtained to evaluate the vascular anatomy. Multidetector CT imaging of the abdomen and pelvis was performed using the standard protocol during bolus administration of intravenous contrast. RADIATION DOSE REDUCTION: This exam was performed according to the departmental dose-optimization program which includes automated exposure control, adjustment of the mA and/or kV according to patient size and/or use of iterative reconstruction technique. CONTRAST:  85mL OMNIPAQUE IOHEXOL 350 MG/ML SOLN COMPARISON:  CT abdomen 09/29/2020 FINDINGS: CTA CHEST FINDINGS Cardiovascular: There is adequate opacification of the pulmonary arterial tree. No intraluminal filling defect identified to suggest acute pulmonary embolism. The central pulmonary arteries are of normal caliber. Moderate coronary artery calcification. Global cardiac  size is within normal limits though left ventricular dilation is noted, progressive since prior examination. No pericardial effusion. The thoracic aorta is of normal caliber. Mild atherosclerotic calcification noted within the aortic arch. Right internal jugular chest port tip noted within the superior vena cava. Mediastinum/Nodes: Endotracheal tube seen 4 cm above the carina. Nasogastric tube extends into the proximal body of the stomach. There is marked body wall wasting and diffuse subcutaneous and mediastinal edema which impairs delineation of the mediastinal structures. Visualized thyroid is unremarkable. Lungs/Pleura: There is interlobular septal thickening, best appreciated within the lung apices, with superimposed ground-glass pulmonary infiltrate most suggestive of interstitial and alveolar pulmonary edema. Upper lobe predominance may relate to supine positioning and pulmonary vascular redistribution. Layering debris noted within the central airways. Mild bibasilar dependent atelectasis. No pneumothorax or pleural effusion. Musculoskeletal: No acute bone abnormality. No lytic or blastic bone lesion. Review of the MIP images confirms the above findings. CT ABDOMEN and PELVIS FINDINGS Hepatobiliary: Innumerable hepatic metastases are seen throughout both hepatic lobes in keeping with the patient's known metastatic pancreatic cancer. Mild pneumobilia is present. Palliative stenting of the extrahepatic bile duct has been performed with a a metallic biliary stent noted extending into the second portion of the duodenum. Gallbladder is unremarkable. 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 There is atrophy of the pancreatic body and tail again noted. Spleen: Unremarkable Adrenals/Urinary Tract: Adrenal glands are unremarkable. Kidneys are normal,  without renal calculi, focal lesion, or hydronephrosis. Bladder is decompressed with a Foley catheter balloon seen within its lumen. Stomach/Bowel: The stomach is mildly  distended and fluid-filled. The pancreatic mass abuts an narrows the first portion of the duodenum, best seen on image # 31/5, possibly resulting in some degree of gastric outlet obstruction. The stomach, small bowel, and large bowel are otherwise unremarkable. Mild ascites has developed. Vascular/Lymphatic: Mild aortoiliac atherosclerotic calcification. No aortic aneurysm. Multiple necrotic retroperitoneal lymph nodes have developed, in keeping with developing nodal metastases within the a perirenal region Reproductive: Prostate is unremarkable. Other: There is marked diffuse body wall wasting and subcutaneous edema noted. There is extensive retroperitoneal edema which limits delineation of the retroperitoneal structures. Musculoskeletal: No acute bone abnormality. No lytic or blastic bone lesion. Review of the MIP images confirms the above findings. 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 Marked body wall wasting with severe anasarca. No pulmonary embolism. Interval development of left ventricular dilation. Diffuse interstitial and ground-glass alveolar pulmonary infiltrate most in keeping with interstitial and alveolar pulmonary edema, possibly cardiogenic in nature. Electronically Signed   By: Helyn Numbers M.D.   On: 11/29/2021 01:04   DG Chest Portable 1 View  Result Date: 11/28/2021 CLINICAL DATA:  Intubation EXAM: PORTABLE CHEST 1 VIEW COMPARISON:  11/28/2021 at 10:17 p.m. FINDINGS: Unchanged position of  endotracheal tube with tip at the level of the clavicular heads. Esophageal catheter has been advanced but the side port is still above the gastroesophageal junction and should be advanced by 7-10 cm further. Otherwise unchanged examination. IMPRESSION: 1. Unchanged position of endotracheal tube. 2. Esophageal catheter side port still above the gastroesophageal junction. Recommend advancement by 7-10 cm further. 3. Unchanged appearance of the lungs. No pneumothorax. Electronically Signed   By: Deatra Robinson M.D.   On: 11/28/2021 23:19   DG Chest Port 1 View  Result Date: 11/28/2021 CLINICAL DATA:  Questionable sepsis.  ET tube placement. EXAM: PORTABLE CHEST 1 VIEW COMPARISON:  07/12/2021 FINDINGS: Endotracheal tube is 5 cm above the carina. Right Port-A-Cath in place with the tip in the right atrium, unchanged. Heart is normal size. Vascular congestion and peribronchial thickening. No confluent opacities or effusions. No acute bony abnormality. IMPRESSION: Mild vascular congestion. Bronchitic changes. Electronically Signed   By: Charlett Nose M.D.   On: 11/28/2021 22:40   ECHOCARDIOGRAM COMPLETE  Result Date: 12/01/2021    ECHOCARDIOGRAM REPORT   Patient Name:   Brandon Schmitt Date of Exam: 12/01/2021 Medical Rec #:  [id number]     Height:       74.0 in Accession #:    [id number]    Weight:       129.4 lb Date of Birth:  03-16-1961     BSA:          1.806 m Patient Age:    60 years      BP:           106/87 mmHg Patient Gender: M             HR:           105 bpm. Exam Location:  Inpatient Procedure: 2D Echo Indications:    Bacteremia/CHF  History:        Patient has prior history of Echocardiogram examinations, most                 recent 11/20/2020. CHF.  Sonographer:    Eduard Roux Referring Phys: [id number] Rhen Kawecki IMPRESSIONS  1. Left ventricular ejection fraction, by estimation, is <20%. The left ventricle has severely decreased function. The left ventricle demonstrates global hypokinesis. The left  ventricular internal cavity size was severely dilated. Left ventricular diastolic parameters are consistent with Grade III diastolic dysfunction (restrictive).  2. Right ventricular systolic function is normal. The right ventricular size is normal.  3. Left atrial size was moderately dilated.  4. Right atrial size was mildly dilated.  5. There is thickening of the posterior leaflet of the MV. This could be a bacterial vegetation / endocarditis .     Marland Kitchen The mitral valve is abnormal. Moderate mitral valve regurgitation.  6. There is a large mobil mass on the ventricular aspect of the TV. This mass appears c/w a vegetation and was not seen on the previous echo o Feb. 18, 2022.     . The tricuspid valve is abnormal.  7. There is a marked reduction in the mobility of the aortic valve leaflets . Marland Kitchen The aortic valve is tricuspid. There is mild calcification of the aortic valve. Aortic valve regurgitation is not visualized. FINDINGS  Left Ventricle: Left ventricular ejection fraction, by estimation, is <20%. The left ventricle has severely decreased function. The left ventricle demonstrates global hypokinesis. The left ventricular internal cavity size was severely dilated. There is no left ventricular hypertrophy. Left ventricular diastolic parameters are consistent with Grade III diastolic dysfunction (restrictive). Right Ventricle: The right ventricular size is normal. Right vetricular wall thickness was not well visualized. Right ventricular systolic function is normal. Left Atrium: Left atrial size was moderately dilated. Right Atrium: Right atrial size was mildly dilated. Pericardium: There is no evidence of pericardial effusion. Mitral Valve: There is thickening of the posterior leaflet of the MV. This could be a bacterial vegetation / endocarditis. The mitral valve is abnormal. Moderate mitral valve regurgitation. Tricuspid Valve: There is a large mobil mass on the ventricular aspect of the TV. This mass appears c/w a  vegetation and was not seen on the previous echo o Feb. 18, 2022. The tricuspid valve is abnormal. Tricuspid valve regurgitation is mild. Aortic Valve: There is a marked reduction in the mobility of the aortic valve leaflets. The aortic valve is tricuspid. There is mild calcification of the aortic valve. There is moderate aortic valve annular calcification. Aortic valve regurgitation is not visualized. Aortic valve peak gradient measures 4.6 mmHg. Pulmonic Valve: The pulmonic valve was not well visualized. Pulmonic valve regurgitation is not visualized. Aorta: The aortic root and ascending aorta are structurally normal, with no evidence of dilitation. IAS/Shunts: The atrial septum is grossly normal.  LEFT VENTRICLE PLAX 2D LVIDd:         7.40 cm LVIDs:         6.95 cm LV PW:         1.00 cm LV IVS:        0.85 cm LVOT diam:     2.05 cm LV SV:         23 LV SV Index:   13 LVOT Area:     3.30 cm  RIGHT VENTRICLE RV S prime:     6.81 cm/s TAPSE (M-mode): 1.8 cm LEFT ATRIUM             Index        RIGHT ATRIUM           Index LA diam:  4.50 cm 2.49 cm/m   RA Area:     18.20 cm LA Vol (A2C):   88.3 ml 48.88 ml/m  RA Volume:   53.20 ml  29.45 ml/m LA Vol (A4C):   76.0 ml 42.07 ml/m LA Biplane Vol: 84.3 ml 46.67 ml/m  AORTIC VALVE AV Area (Vmax): 1.76 cm AV Vmax:        107.67 cm/s AV Peak Grad:   4.6 mmHg LVOT Vmax:      57.40 cm/s LVOT Vmean:     31.700 cm/s LVOT VTI:       0.071 m  AORTA Ao Root diam: 3.30 cm MITRAL VALVE                TRICUSPID VALVE MV Area (PHT): 5.66 cm     TR Peak grad:   35.8 mmHg MV Decel Time: 134 msec     TR Vmax:        299.00 cm/s MR Peak grad: 80.6 mmHg MR Vmax:      449.00 cm/s   SHUNTS MV E velocity: 276.00 cm/s  Systemic VTI:  0.07 m                             Systemic Diam: 2.05 cm Kristeen Miss MD Electronically signed by Kristeen Miss MD Signature Date/Time: 12/01/2021/3:02:37 PM    Final      Discharge Exam: Vitals:   2021-12-27 0416 December 27, 2021 0754  BP: 91/79 100/74   Pulse: 93 100  Resp: 18 16  Temp:  97.9 F (36.6 C)  SpO2: 99% 100%   Vitals:   12/01/21 2230 12/27/2021 0000 2021/12/27 0416 12/27/21 0754  BP:  (!) 97/50 91/79 100/74  Pulse: (!) 107 100 93 100  Resp: 18 18 18 16   Temp: 98.4 F (36.9 C) 97.9 F (36.6 C)  97.9 F (36.6 C)  TempSrc:   Axillary Oral  SpO2: 100% 100% 99% 100%  Weight:      Height:        General: Pt is alert, awake, slightly uncomfortable due to back pain and abdominal pain. Cardiovascular: RRR, S1/S2 +, no rubs, no gallops Respiratory: CTA bilaterally, no wheezing, no rhonchi Abdominal: Soft, NT, ND, bowel sounds + Extremities: no edema, no cyanosis    The results of significant diagnostics from this hospitalization (including imaging, microbiology, ancillary and laboratory) are listed below for reference.     Microbiology: Recent Results (from the past 240 hour(s))  Resp Panel by RT-PCR (Flu A&B, Covid) Nasopharyngeal Swab     Status: None   Collection Time: 11/28/21  9:44 PM   Specimen: Nasopharyngeal Swab; Nasopharyngeal(NP) swabs in vial transport medium  Result Value Ref Range Status   SARS Coronavirus 2 by RT PCR NEGATIVE NEGATIVE Final    Comment: (NOTE) SARS-CoV-2 target nucleic acids are NOT DETECTED.  The SARS-CoV-2 RNA is generally detectable in upper respiratory specimens during the acute phase of infection. The lowest concentration of SARS-CoV-2 viral copies this assay can detect is 138 copies/mL. A negative result does not preclude SARS-Cov-2 infection and should not be used as the sole basis for treatment or other patient management decisions. A negative result may occur with  improper specimen collection/handling, submission of specimen other than nasopharyngeal swab, presence of viral mutation(s) within the areas targeted by this assay, and inadequate number of viral copies(<138 copies/mL). A negative result must be combined with clinical observations, patient history, and  epidemiological information. The expected  result is Negative.  Fact Sheet for Patients:  BloggerCourse.com  Fact Sheet for Healthcare Providers:  SeriousBroker.it  This test is no t yet approved or cleared by the [place] FDA and  has been authorized for detection and/or diagnosis of SARS-CoV-2 by FDA under an Emergency Use Authorization (EUA). This EUA will remain  in effect (meaning this test can be used) for the duration of the COVID-19 declaration under Section 564(b)(1) of the Act, 21 U.S.C.section 360bbb-3(b)(1), unless the authorization is terminated  or revoked sooner.       Influenza A by PCR NEGATIVE NEGATIVE Final   Influenza B by PCR NEGATIVE NEGATIVE Final    Comment: (NOTE) The Xpert Xpress SARS-CoV-2/FLU/RSV plus assay is intended as an aid in the diagnosis of influenza from Nasopharyngeal swab specimens and should not be used as a sole basis for treatment. Nasal washings and aspirates are unacceptable for Xpert Xpress SARS-CoV-2/FLU/RSV testing.  Fact Sheet for Patients: BloggerCourse.com  Fact Sheet for Healthcare Providers: SeriousBroker.it  This test is not yet approved or cleared by the [place] FDA and has been authorized for detection and/or diagnosis of SARS-CoV-2 by FDA under an Emergency Use Authorization (EUA). This EUA will remain in effect (meaning this test can be used) for the duration of the COVID-19 declaration under Section 564(b)(1) of the Act, 21 U.S.C. section 360bbb-3(b)(1), unless the authorization is terminated or revoked.  Performed at [facility], [address] [place], [place] [address]   Urine Culture     Status: None   Collection Time: 11/28/21 11:04 PM   Specimen: In/Out Cath Urine  Result Value Ref Range Status   Specimen Description IN/OUT CATH URINE  Final   Special Requests STERILE CUP  Final   Culture    Final    NO GROWTH Performed at [facility], [address], [place], [place] [address]    Report Status 11/30/2021 FINAL  Final  Blood Culture (routine x 2)     Status: Abnormal   Collection Time: 11/28/21 11:19 PM   Specimen: BLOOD  Result Value Ref Range Status   Specimen Description BLOOD SITE NOT SPECIFIED  Final   Special Requests   Final    BOTTLES DRAWN AEROBIC AND ANAEROBIC Blood Culture adequate volume   Culture  Setup Time   Final    GRAM NEGATIVE RODS AEROBIC BOTTLE ONLY CRITICAL RESULT CALLED TO, READ BACK BY AND VERIFIED WITH: PHARMD KAREN AMEND 11/29/21@21 :49 BY TW IN BOTH AEROBIC AND ANAEROBIC BOTTLES Performed at [facility], [address] [place], [place] [address]    Culture KLEBSIELLA PNEUMONIAE (A)  Final   Report Status 12/08/21 FINAL  Final   Organism ID, Bacteria KLEBSIELLA PNEUMONIAE  Final      Susceptibility   Klebsiella pneumoniae - MIC*    AMPICILLIN >=32 RESISTANT Resistant     CEFAZOLIN <=4 SENSITIVE Sensitive     CEFEPIME <=0.12 SENSITIVE Sensitive     CEFTAZIDIME <=1 SENSITIVE Sensitive     CEFTRIAXONE <=0.25 SENSITIVE Sensitive     CIPROFLOXACIN <=0.25 SENSITIVE Sensitive     GENTAMICIN <=1 SENSITIVE Sensitive     IMIPENEM <=0.25 SENSITIVE Sensitive     TRIMETH/SULFA <=20 SENSITIVE Sensitive     AMPICILLIN/SULBACTAM 4 SENSITIVE Sensitive     PIP/TAZO <=4 SENSITIVE Sensitive     * KLEBSIELLA PNEUMONIAE  Blood Culture ID Panel (Reflexed)     Status: Abnormal   Collection Time: 11/28/21 11:19 PM  Result Value Ref  Range Status   Enterococcus faecalis NOT DETECTED NOT DETECTED Final   Enterococcus Faecium NOT DETECTED NOT DETECTED Final   Listeria monocytogenes NOT DETECTED NOT DETECTED Final   Staphylococcus species NOT DETECTED NOT DETECTED Final   Staphylococcus aureus (BCID) NOT DETECTED NOT DETECTED Final   Staphylococcus epidermidis NOT DETECTED NOT DETECTED Final   Staphylococcus lugdunensis NOT DETECTED NOT DETECTED  Final   Streptococcus species NOT DETECTED NOT DETECTED Final   Streptococcus agalactiae NOT DETECTED NOT DETECTED Final   Streptococcus pneumoniae NOT DETECTED NOT DETECTED Final   Streptococcus pyogenes NOT DETECTED NOT DETECTED Final   A.calcoaceticus-baumannii NOT DETECTED NOT DETECTED Final   Bacteroides fragilis NOT DETECTED NOT DETECTED Final   Enterobacterales DETECTED (A) NOT DETECTED Final    Comment: Enterobacterales represent a large order of gram negative bacteria, not a single organism. CRITICAL RESULT CALLED TO, READ BACK BY AND VERIFIED WITH: PHARMD KAREN AMEND 11/29/21@21 :49 BY TW    Enterobacter cloacae complex NOT DETECTED NOT DETECTED Final   Escherichia coli NOT DETECTED NOT DETECTED Final   Klebsiella aerogenes NOT DETECTED NOT DETECTED Final   Klebsiella oxytoca NOT DETECTED NOT DETECTED Final   Klebsiella pneumoniae DETECTED (A) NOT DETECTED Final    Comment: CRITICAL RESULT CALLED TO, READ BACK BY AND VERIFIED WITH: PHARMD KAREN AMEND 11/29/21@21 :49 BY TW    Proteus species NOT DETECTED NOT DETECTED Final   Salmonella species NOT DETECTED NOT DETECTED Final   Serratia marcescens NOT DETECTED NOT DETECTED Final   Haemophilus influenzae NOT DETECTED NOT DETECTED Final   Neisseria meningitidis NOT DETECTED NOT DETECTED Final   Pseudomonas aeruginosa NOT DETECTED NOT DETECTED Final   Stenotrophomonas maltophilia NOT DETECTED NOT DETECTED Final   Candida albicans NOT DETECTED NOT DETECTED Final   Candida auris NOT DETECTED NOT DETECTED Final   Candida glabrata NOT DETECTED NOT DETECTED Final   Candida krusei NOT DETECTED NOT DETECTED Final   Candida parapsilosis NOT DETECTED NOT DETECTED Final   Candida tropicalis NOT DETECTED NOT DETECTED Final   Cryptococcus neoformans/gattii NOT DETECTED NOT DETECTED Final   CTX-M ESBL NOT DETECTED NOT DETECTED Final   Carbapenem resistance IMP NOT DETECTED NOT DETECTED Final   Carbapenem resistance KPC NOT DETECTED NOT  DETECTED Final   Carbapenem resistance NDM NOT DETECTED NOT DETECTED Final   Carbapenem resist OXA 48 LIKE NOT DETECTED NOT DETECTED Final   Carbapenem resistance VIM NOT DETECTED NOT DETECTED Final    Comment: Performed at [facility], [address] [place], [place] [address]  MRSA Next Gen by PCR, Nasal     Status: None   Collection Time: 11/29/21  1:49 AM   Specimen: Nasal Mucosa; Nasal Swab  Result Value Ref Range Status   MRSA by PCR Next Gen NOT DETECTED NOT DETECTED Final    Comment: (NOTE) The GeneXpert MRSA Assay (FDA approved for NASAL specimens only), is one component of a comprehensive MRSA colonization surveillance program. It is not intended to diagnose MRSA infection nor to guide or monitor treatment for MRSA infections. Test performance is not FDA approved in patients less than 1 years old. Performed at [facility], [address], [place], [place] [address]   Blood Culture (routine x 2)     Status: None (Preliminary result)   Collection Time: 11/29/21  2:47 AM   Specimen: BLOOD LEFT HAND  Result Value Ref Range Status   Specimen Description BLOOD LEFT HAND  Final   Special Requests   Final  BOTTLES DRAWN AEROBIC AND ANAEROBIC Blood Culture adequate volume   Culture   Final    NO GROWTH 2 DAYS Performed at [facility], [address] [place], [place] [address]    Report Status PENDING  Incomplete     Labs: BNP (last 3 results) Recent Labs    07/13/21 0158  BNP >4,500.0*   Basic Metabolic Panel: Recent Labs  Lab 11/28/21 2218 11/28/21 2227 11/28/21 2351 11/29/21 0246 11/29/21 0344 11/29/21 1247 11/30/21 0055 12/01/21 0105 2021-12-19 0425  NA 143 144   < >  --  143 142 142 142 143  K 2.9* 2.8*   < >  --  3.0* 3.8 3.4* 4.3 4.5  CL 97* 93*  --   --   --  96* 95* 97* 100  CO2 38*  --   --   --   --  38* 40* 37* 34*  GLUCOSE <20* <20*  --   --   --  149* 210* 104* 79  BUN 53* 48*  --   --   --  51* 45* 45* 60*  CREATININE  0.56* 0.70  --  0.62  --  0.63 0.71 0.62 0.79  CALCIUM 6.9*  --   --   --   --  7.5* 7.5* 7.8* 7.9*  MG  --   --   --   --   --  2.2 2.1 2.0 2.1  PHOS  --   --   --   --   --  3.3  --  2.7  --    < > = values in this interval not displayed.   Liver Function Tests: Recent Labs  Lab 11/28/21 2218 11/29/21 1247 11/30/21 0055 12/01/21 0105 2021/12/19 0425  AST 125* 197* 145* 394* 229*  ALT 92* 128* 125* 200* 188*  ALKPHOS 109 153* 150* 207* 214*  BILITOT 2.4* 2.7* 2.1* 3.4* 2.9*  PROT 4.4* 4.6* 4.7* 4.9* 4.8*  ALBUMIN 2.1* 2.3* 2.3* 2.2* 2.3*   No results for input(s): LIPASE, AMYLASE in the last 168 hours. Recent Labs  Lab 11/29/21 0708  AMMONIA 63*   CBC: Recent Labs  Lab 11/28/21 2218 11/28/21 2227 11/29/21 0246 11/29/21 0344 11/30/21 0055 12/01/21 0105 19-Dec-2021 0425  WBC 7.3  --  6.7  --  11.3* 5.9 8.4  NEUTROABS 6.5  --   --   --  10.4* 5.0 7.7  HGB 9.7*   < > 9.0* 9.2* 9.9* 10.9* 9.9*  HCT 29.2*   < > 27.6* 27.0* 30.4* 33.6* 29.5*  MCV 88.0  --  89.3  --  90.5 89.6 89.4  PLT 85*  --  78*  --  72* 55* 42*   < > = values in this interval not displayed.   Cardiac Enzymes: No results for input(s): CKTOTAL, CKMB, CKMBINDEX, TROPONINI in the last 168 hours. BNP: Invalid input(s): POCBNP CBG: Recent Labs  Lab 12/01/21 1535 12/01/21 1644 12/01/21 1937 12-19-2021 0033 December 19, 2021 0102  GLUCAP 65* 69* 71 53* 147*   D-Dimer No results for input(s): DDIMER in the last 72 hours. Hgb A1c No results for input(s): HGBA1C in the last 72 hours. Lipid Profile No results for input(s): CHOL, HDL, LDLCALC, TRIG, CHOLHDL, LDLDIRECT in the last 72 hours. Thyroid function studies No results for input(s): TSH, T4TOTAL, T3FREE, THYROIDAB in the last 72 hours.  Invalid input(s): FREET3 Anemia work up No results for input(s): VITAMINB12, FOLATE, FERRITIN, TIBC, IRON, RETICCTPCT in the last 72 hours. Urinalysis  Component Value Date/Time   COLORURINE YELLOW 11/28/2021 2254    APPEARANCEUR CLEAR 11/28/2021 2254   LABSPEC 1.016 11/28/2021 2254   PHURINE 6.0 11/28/2021 2254   GLUCOSEU 50 (A) 11/28/2021 2254   HGBUR SMALL (A) 11/28/2021 2254   BILIRUBINUR NEGATIVE 11/28/2021 2254   KETONESUR NEGATIVE 11/28/2021 2254   PROTEINUR NEGATIVE 11/28/2021 2254   NITRITE NEGATIVE 11/28/2021 2254   LEUKOCYTESUR NEGATIVE 11/28/2021 2254   Sepsis Labs Invalid input(s): PROCALCITONIN,  WBC,  LACTICIDVEN Microbiology Recent Results (from the past 240 hour(s))  Resp Panel by RT-PCR (Flu A&B, Covid) Nasopharyngeal Swab     Status: None   Collection Time: 11/28/21  9:44 PM   Specimen: Nasopharyngeal Swab; Nasopharyngeal(NP) swabs in vial transport medium  Result Value Ref Range Status   SARS Coronavirus 2 by RT PCR NEGATIVE NEGATIVE Final    Comment: (NOTE) SARS-CoV-2 target nucleic acids are NOT DETECTED.  The SARS-CoV-2 RNA is generally detectable in upper respiratory specimens during the acute phase of infection. The lowest concentration of SARS-CoV-2 viral copies this assay can detect is 138 copies/mL. A negative result does not preclude SARS-Cov-2 infection and should not be used as the sole basis for treatment or other patient management decisions. A negative result may occur with  improper specimen collection/handling, submission of specimen other than nasopharyngeal swab, presence of viral mutation(s) within the areas targeted by this assay, and inadequate number of viral copies(<138 copies/mL). A negative result must be combined with clinical observations, patient history, and epidemiological information. The expected result is Negative.  Fact Sheet for Patients:  BloggerCourse.com  Fact Sheet for Healthcare Providers:  SeriousBroker.it  This test is no t yet approved or cleared by the [place] FDA and  has been authorized for detection and/or diagnosis of SARS-CoV-2 by FDA under an Emergency Use  Authorization (EUA). This EUA will remain  in effect (meaning this test can be used) for the duration of the COVID-19 declaration under Section 564(b)(1) of the Act, 21 U.S.C.section 360bbb-3(b)(1), unless the authorization is terminated  or revoked sooner.       Influenza A by PCR NEGATIVE NEGATIVE Final   Influenza B by PCR NEGATIVE NEGATIVE Final    Comment: (NOTE) The Xpert Xpress SARS-CoV-2/FLU/RSV plus assay is intended as an aid in the diagnosis of influenza from Nasopharyngeal swab specimens and should not be used as a sole basis for treatment. Nasal washings and aspirates are unacceptable for Xpert Xpress SARS-CoV-2/FLU/RSV testing.  Fact Sheet for Patients: BloggerCourse.com  Fact Sheet for Healthcare Providers: SeriousBroker.it  This test is not yet approved or cleared by the [place] FDA and has been authorized for detection and/or diagnosis of SARS-CoV-2 by FDA under an Emergency Use Authorization (EUA). This EUA will remain in effect (meaning this test can be used) for the duration of the COVID-19 declaration under Section 564(b)(1) of the Act, 21 U.S.C. section 360bbb-3(b)(1), unless the authorization is terminated or revoked.  Performed at [facility], [address], [place], [place] [address]   Urine Culture     Status: None   Collection Time: 11/28/21 11:04 PM   Specimen: In/Out Cath Urine  Result Value Ref Range Status   Specimen Description IN/OUT CATH URINE  Final   Special Requests STERILE CUP  Final   Culture   Final    NO GROWTH Performed at [facility], [address] [place], [place] [address]    Report Status 11/30/2021 FINAL  Final  Blood Culture (routine x  2)     Status: Abnormal   Collection Time: 11/28/21 11:19 PM   Specimen: BLOOD  Result Value Ref Range Status   Specimen Description BLOOD SITE NOT SPECIFIED  Final   Special Requests   Final    BOTTLES DRAWN AEROBIC  AND ANAEROBIC Blood Culture adequate volume   Culture  Setup Time   Final    GRAM NEGATIVE RODS AEROBIC BOTTLE ONLY CRITICAL RESULT CALLED TO, READ BACK BY AND VERIFIED WITH: PHARMD KAREN AMEND 11/29/21@21 :49 BY TW IN BOTH AEROBIC AND ANAEROBIC BOTTLES Performed at [facility], [address] [place], [place] [address]    Culture KLEBSIELLA PNEUMONIAE (A)  Final   Report Status 12/05/21 FINAL  Final   Organism ID, Bacteria KLEBSIELLA PNEUMONIAE  Final      Susceptibility   Klebsiella pneumoniae - MIC*    AMPICILLIN >=32 RESISTANT Resistant     CEFAZOLIN <=4 SENSITIVE Sensitive     CEFEPIME <=0.12 SENSITIVE Sensitive     CEFTAZIDIME <=1 SENSITIVE Sensitive     CEFTRIAXONE <=0.25 SENSITIVE Sensitive     CIPROFLOXACIN <=0.25 SENSITIVE Sensitive     GENTAMICIN <=1 SENSITIVE Sensitive     IMIPENEM <=0.25 SENSITIVE Sensitive     TRIMETH/SULFA <=20 SENSITIVE Sensitive     AMPICILLIN/SULBACTAM 4 SENSITIVE Sensitive     PIP/TAZO <=4 SENSITIVE Sensitive     * KLEBSIELLA PNEUMONIAE  Blood Culture ID Panel (Reflexed)     Status: Abnormal   Collection Time: 11/28/21 11:19 PM  Result Value Ref Range Status   Enterococcus faecalis NOT DETECTED NOT DETECTED Final   Enterococcus Faecium NOT DETECTED NOT DETECTED Final   Listeria monocytogenes NOT DETECTED NOT DETECTED Final   Staphylococcus species NOT DETECTED NOT DETECTED Final   Staphylococcus aureus (BCID) NOT DETECTED NOT DETECTED Final   Staphylococcus epidermidis NOT DETECTED NOT DETECTED Final   Staphylococcus lugdunensis NOT DETECTED NOT DETECTED Final   Streptococcus species NOT DETECTED NOT DETECTED Final   Streptococcus agalactiae NOT DETECTED NOT DETECTED Final   Streptococcus pneumoniae NOT DETECTED NOT DETECTED Final   Streptococcus pyogenes NOT DETECTED NOT DETECTED Final   A.calcoaceticus-baumannii NOT DETECTED NOT DETECTED Final   Bacteroides fragilis NOT DETECTED NOT DETECTED Final   Enterobacterales DETECTED (A)  NOT DETECTED Final    Comment: Enterobacterales represent a large order of gram negative bacteria, not a single organism. CRITICAL RESULT CALLED TO, READ BACK BY AND VERIFIED WITH: PHARMD KAREN AMEND 11/29/21@21 :49 BY TW    Enterobacter cloacae complex NOT DETECTED NOT DETECTED Final   Escherichia coli NOT DETECTED NOT DETECTED Final   Klebsiella aerogenes NOT DETECTED NOT DETECTED Final   Klebsiella oxytoca NOT DETECTED NOT DETECTED Final   Klebsiella pneumoniae DETECTED (A) NOT DETECTED Final    Comment: CRITICAL RESULT CALLED TO, READ BACK BY AND VERIFIED WITH: PHARMD KAREN AMEND 11/29/21@21 :49 BY TW    Proteus species NOT DETECTED NOT DETECTED Final   Salmonella species NOT DETECTED NOT DETECTED Final   Serratia marcescens NOT DETECTED NOT DETECTED Final   Haemophilus influenzae NOT DETECTED NOT DETECTED Final   Neisseria meningitidis NOT DETECTED NOT DETECTED Final   Pseudomonas aeruginosa NOT DETECTED NOT DETECTED Final   Stenotrophomonas maltophilia NOT DETECTED NOT DETECTED Final   Candida albicans NOT DETECTED NOT DETECTED Final   Candida auris NOT DETECTED NOT DETECTED Final   Candida glabrata NOT DETECTED NOT DETECTED Final   Candida krusei NOT DETECTED NOT DETECTED Final   Candida parapsilosis NOT DETECTED NOT DETECTED Final  Candida tropicalis NOT DETECTED NOT DETECTED Final   Cryptococcus neoformans/gattii NOT DETECTED NOT DETECTED Final   CTX-M ESBL NOT DETECTED NOT DETECTED Final   Carbapenem resistance IMP NOT DETECTED NOT DETECTED Final   Carbapenem resistance KPC NOT DETECTED NOT DETECTED Final   Carbapenem resistance NDM NOT DETECTED NOT DETECTED Final   Carbapenem resist OXA 48 LIKE NOT DETECTED NOT DETECTED Final   Carbapenem resistance VIM NOT DETECTED NOT DETECTED Final    Comment: Performed at [facility], [address] [place], [place] [address]  MRSA Next Gen by PCR, Nasal     Status: None   Collection Time: 11/29/21  1:49 AM   Specimen: Nasal  Mucosa; Nasal Swab  Result Value Ref Range Status   MRSA by PCR Next Gen NOT DETECTED NOT DETECTED Final    Comment: (NOTE) The GeneXpert MRSA Assay (FDA approved for NASAL specimens only), is one component of a comprehensive MRSA colonization surveillance program. It is not intended to diagnose MRSA infection nor to guide or monitor treatment for MRSA infections. Test performance is not FDA approved in patients less than 76 years old. Performed at [facility], [address], [place], [place] [address]   Blood Culture (routine x 2)     Status: None (Preliminary result)   Collection Time: 11/29/21  2:47 AM   Specimen: BLOOD LEFT HAND  Result Value Ref Range Status   Specimen Description BLOOD LEFT HAND  Final   Special Requests   Final    BOTTLES DRAWN AEROBIC AND ANAEROBIC Blood Culture adequate volume   Culture   Final    NO GROWTH 2 DAYS Performed at [facility], [address] [place], [place] [address]    Report Status PENDING  Incomplete     Time coordinating discharge: Over 30 minutes  SIGNED:   Hughie Closs, MD  [facility] 12-10-21, 10:42 AM  If 7PM-7AM, please contact night-coverage [email or web address]

## 2022-01-01 NOTE — Progress Notes (Signed)
Patient discharged to [facility] via [place], family was at bedside and took all his belongings. ?

## 2022-01-01 NOTE — Progress Notes (Signed)
?                                                   ?Daily Progress Note  ? ?Patient Name: Brandon Schmitt       Date: 12-18-21 ?DOB: 09-14-1961  Age: 61 y.o. MRN#: [id number] ?Attending Physician: Darliss Cheney, MD ?Primary Care Physician: Mackie Pai, PA-C ?Admit Date: 11/28/2021 ? ?Reason for Consultation/Follow-up: Establishing goals of care ? ?Subjective: ?Patient lethargic, opens eyes to voice.  Multiple family members at bedside.  Patient complains of some generalized pain but ill-defined. ? ?Length of Stay: 4 ? ?Current Medications: ?Scheduled Meds:  ? antiseptic oral rinse  15 mL Topical TID  ? pantoprazole sodium  40 mg Per Tube Daily  ? ? ?Continuous Infusions: ? sodium chloride 10 mL/hr at 12/01/21 2300  ? dextrose 10 % and 0.45 % NaCl 50 mL/hr at 12/01/21 2300  ? ? ?PRN Meds: ?sodium chloride, glycopyrrolate **OR** glycopyrrolate **OR** glycopyrrolate, haloperidol **OR** haloperidol **OR** haloperidol lactate, LORazepam **OR** LORazepam **OR** LORazepam, morphine injection, oxyCODONE, polyethylene glycol, polyvinyl alcohol ? ?Physical Exam ?Constitutional:   ?   General: He is not in acute distress. ?   Appearance: He is ill-appearing.  ?   Comments: lethargic  ?Pulmonary:  ?   Effort: Pulmonary effort is normal.  ?Skin: ?   General: Skin is warm and dry.  ?Neurological:  ?   Mental Status: He is oriented to person, place, and time.  ?         ? ?Vital Signs: BP 100/74 (BP Location: Left Arm)   Pulse 100   Temp 97.9 ?F (36.6 ?C) (Oral)   Resp 16   Ht _0  (1.88 m)   Wt 58.7 kg   SpO2 100%   BMI 16.62 kg/m?  ?SpO2: SpO2: 100 % ?O2 Device: O2 Device: Nasal Cannula ?O2 Flow Rate: O2 Flow Rate (L/min): 4 L/min ? ?Intake/output summary:  ?Intake/Output Summary (Last 24 hours) at 12/18/2021 1151 ?Last data filed at 12/01/2021 2300 ?Gross per 24 hour  ?Intake 760 ml   ?Output 425 ml  ?Net 335 ml  ? ?LBM: Last BM Date : 11/30/21 ?Baseline Weight: Weight: 68 kg ?Most recent weight: Weight: 58.7 kg ? ?     ?Palliative Assessment/Data: PPS 20% ? ? ? ?Flowsheet Rows   ? ?Flowsheet Row Most Recent Value  ?Intake Tab   ?Referral Department Critical care  ?Unit at Time of Referral ICU  ?Palliative Care Primary Diagnosis Cancer  ?Date Notified 11/30/21  ?Palliative Care Type New Palliative care  ?Reason for referral Clarify Goals of Care  ?Date of Admission 11/28/21  ?Date first seen by Palliative Care 2021-12-18  ?# of days Palliative referral response time 2 Day(s)  ?# of days IP prior to Palliative referral 2  ?Clinical Assessment   ?Psychosocial & Spiritual Assessment   ?Palliative Care Outcomes   ? ?  ? ? ?Patient Active Problem List  ? Diagnosis Date Noted  ? Pressure injury of skin 11/29/2021  ? Protein-calorie malnutrition, severe 11/29/2021  ? Altered mental status   ? Hypokalemia   ? Encephalopathy 11/28/2021  ? Goals of care, counseling/discussion 09/10/2021  ? CHF (congestive heart failure) ([place]) 07/12/2021  ? Acute on chronic combined systolic and diastolic CHF (congestive heart failure) ([place]) 07/12/2021  ? SOB (shortness of breath) 07/12/2021  ? Elevated troponin  07/12/2021  ? Dilated cardiomyopathy ([place]) 12/09/2020  ? Congestive heart failure (CHF) ([place]) 12/09/2020  ? Pancreatic adenocarcinoma ([place]) 03/31/2020  ? Total bilirubin, elevated 02/13/2020  ? Elevated transaminase level 02/13/2020  ? Tobacco abuse 02/13/2020  ? Alcohol use 02/13/2020  ? Cholelithiasis 02/13/2020  ? Blood clotting disorder ([place]) 07/06/2016  ? ? ?Palliative Care Assessment & Plan  ? ?HPI: ?61 y.o. male  with past medical history of dilated cardiomyopathy, pancreatic cancer diagnosed 2 years ago at [facility] lost to follow up, and DVT over ten years ago admitted on 11/28/2021 with passing out at home. When he arrived at the hospital his GCS was 3 and he required intubation. Extubated 2/28. Concern for acute  on chronic CHF - last echo Feb 2022 revealed EF of 25-30%. Also diagnosed with septic shock d/t Klebsiella pneumonia bacteremia. Patient with ongoing hypoglycemia. 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 PMT consulted to discuss [place].  ? ?Assessment: ?Met with patient and multiple family members at bedside.  Patient slow to respond.  We reviewed conversations from yesterday, we review that decision has been made to focus on comfort and transition to hospice care.  We discussed comfort measures here in the hospital and shifting away from aggressive care.  We reviewed medications available as needed for symptom management.  We review hospice philosophy of care.  All questions and concerns addressed.  Signed DNR placed on chart.  Orders adjusted to reflect comfort measures.  Requested RN administer medication for pain. ? ?Recommendations/Plan: ?Comfort measures only, all orders not needed to ensure comfort discontinued ?Maintain DNR status ?Transfer to hospice facility in [facility] when bed available ? ?Goals of Care and Additional Recommendations: ?Limitations on Scope of Treatment: Full Comfort Care ? ?Code Status: ?DNR ? ?Prognosis: ? < 2 weeks ? ?Discharge Planning: ?Hospice facility-[facility] ? ?Care plan was discussed with nurse, patient, family members at bedside, physician, hospice liaison ? ?Thank you for allowing the Palliative Medicine Team to assist in the care of this patient. ? ?Juel Burrow, DNP, AGNP-C ?Palliative Medicine Team ?Team Phone # [phone]  ?Pager [phone] ? ?

## 2022-01-01 NOTE — Progress Notes (Signed)
OT Cancellation Note ? ?Patient Details ?Name: Brandon Schmitt ?MRN: [id number] ?DOB: 1960/11/18 ? ? ?Cancelled Treatment:    Reason Eval/Treat Not Completed:  (Pt with plans to discharge to residential hospice. Signing off.) ? ?Malka So ?2021/12/15, 9:10 AM ?Nestor Lewandowsky, OTR/L ?Acute Rehabilitation Services ?Pager: [phone] ?Office: [phone]  ?

## 2022-01-01 NOTE — TOC Progression Note (Signed)
Transition of Care (TOC) - Progression Note  ? ? ?Patient Details  ?Name: Brandon Schmitt ?MRN: [id number] ?Date of Birth: 30-Jun-1961 ? ?Transition of Care (TOC) CM/SW Contact  ?Marilu Favre, RN ?Phone Number: ?12-12-21, 11:38 AM ? ?Clinical Narrative:    ? ? ?Patient has been offered a bed at [facility] of the [facility] location.  ?Family has accepted and completed paperwork. Cheri with [facility] ready for patient to be transported .  ?Nurse will call report   [phone] . ? ?PTAR called , PTAR paperwork in chart.  ? ? ? ?Transition of Care (TOC) Screening Note ? ? ?Patient Details  ?Name: Brandon Schmitt ?Date of Birth: Sep 08, 1961 ? ? ?Transition of Care Department [facility]) has reviewed patient and no TOC needs have been identified at this time. We will continue to monitor patient advancement through interdisciplinary progression rounds. If new patient transition needs arise, please place a TOC consult. ?  ?  ?  ? ?Expected Discharge Plan and Services ?  ?  ?  ?  ?  ?Expected Discharge Date: 12-12-21               ?  ?  ?  ?  ?  ?  ?  ?  ?  ?  ? ? ?Social Determinants of Health (SDOH) Interventions ?  ? ?Readmission Risk Interventions ?No flowsheet data found. ? ?

## 2022-01-01 DEATH — deceased

## 2022-02-02 ENCOUNTER — Encounter: Payer: Self-pay | Admitting: *Deleted

## 2022-02-02 NOTE — Progress Notes (Signed)
Oncology Nurse Navigator Documentation ? ? ?  02/02/2022  ?  2:30 PM  ?Oncology Nurse Navigator Flowsheets  ?Navigation Complete Date: 02/02/2022  ?Post Navigation: Continue to Follow Patient? No  ?Reason Not Navigating Patient: Hospice/Death  ?Navigator Location [facility]  ?Time Spent with Patient 15  ?  ?

## 2023-11-25 IMAGING — CT CT ANGIO CHEST
2 of 6 series · 16 of 36 positions shown · IV contrast (agent unspecified)
Comparison: CT abdomen 09/29/2020

CLINICAL DATA: Pulmonary embolism (PE) suspected, high prob;
Abdominal pain, acute, nonlocalized. Generalized weakness,
hypotension, shock.



[Series 9: pe thins · axial · 0.75mm/px · z∈[-351,-36]mm · 15 of 355 slices shown]
[im 20/355  lung]
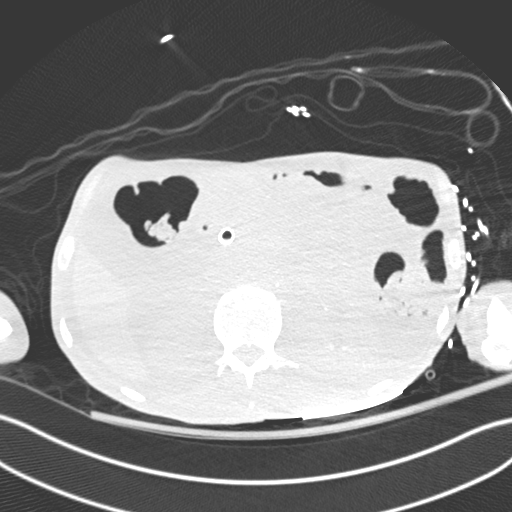
[im 40/355  mediastinal]
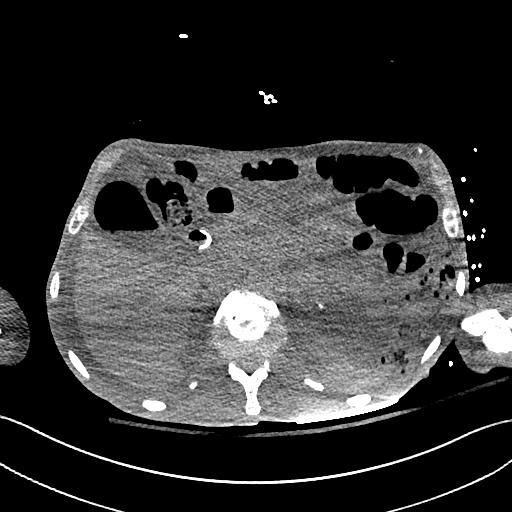
[im 60/355  lung]
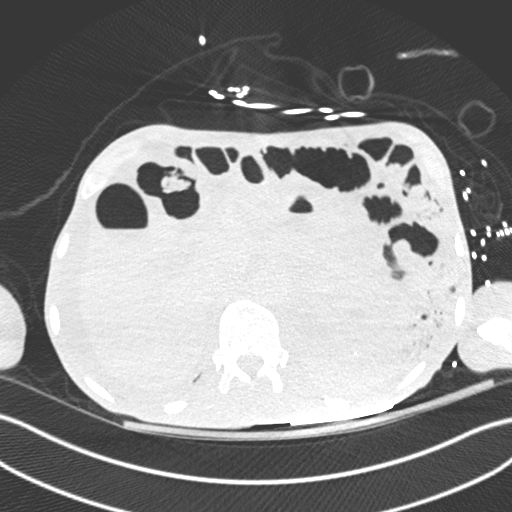
[im 79/355  mediastinal]
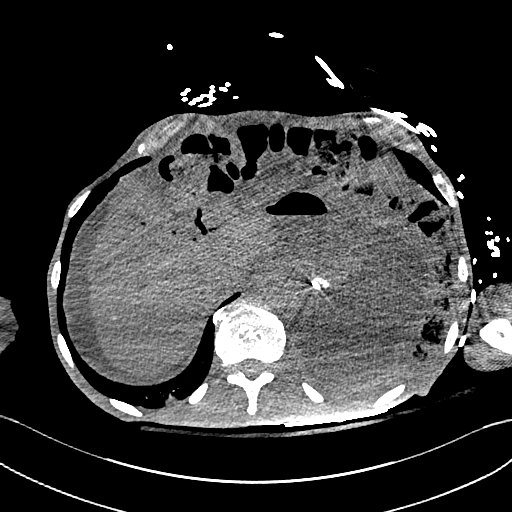
[im 119/355  lung]
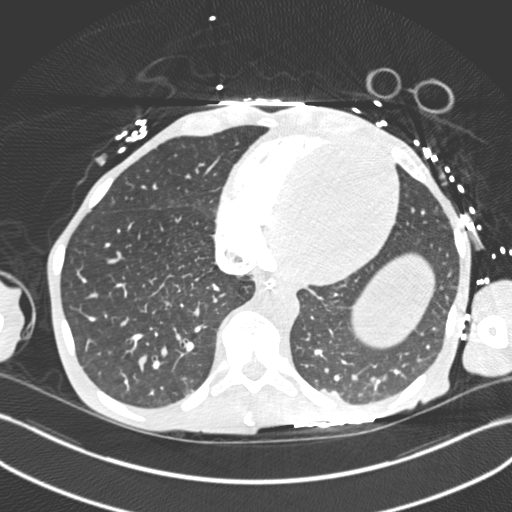
[im 138/355  mediastinal]
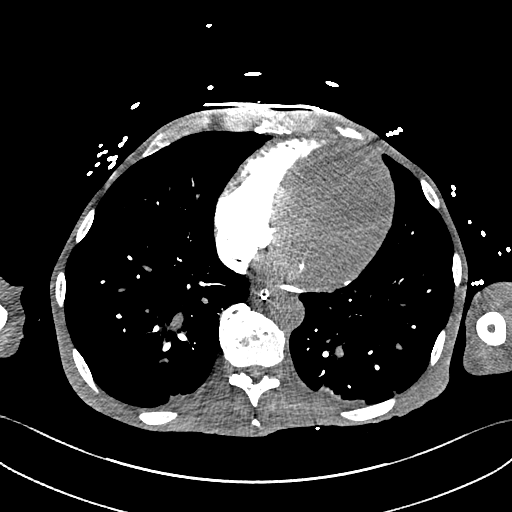
[im 158/355  lung]
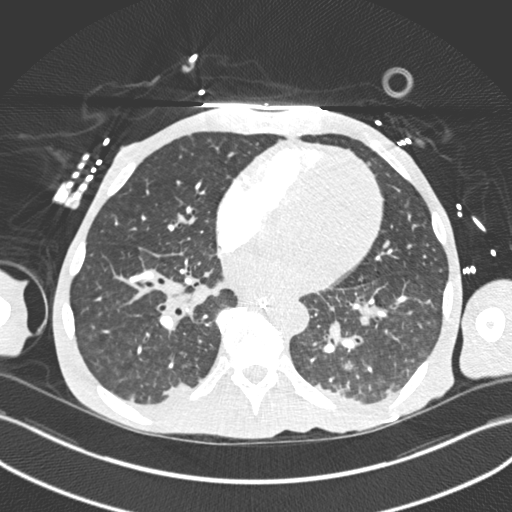
[im 178/355  mediastinal]
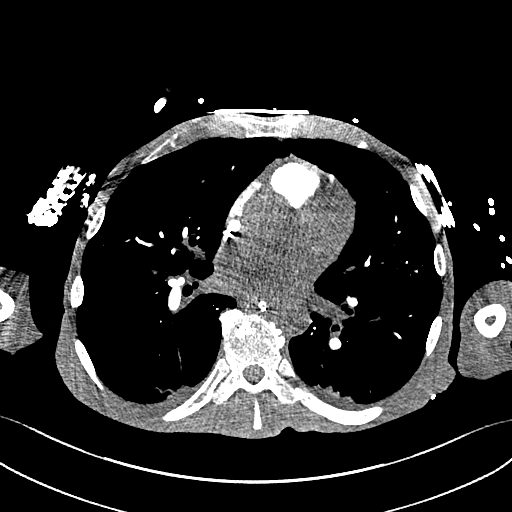
[im 197/355  lung]
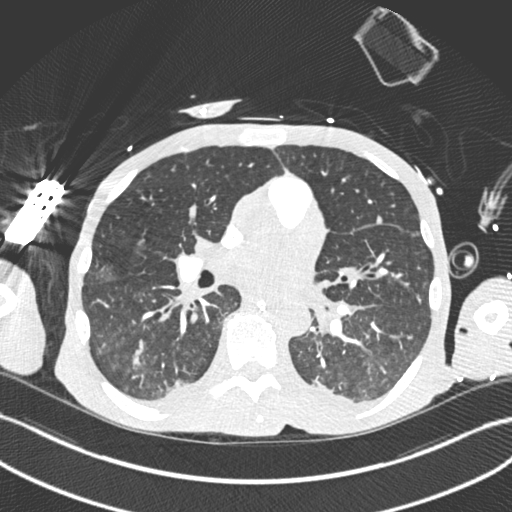
[im 217/355  mediastinal]
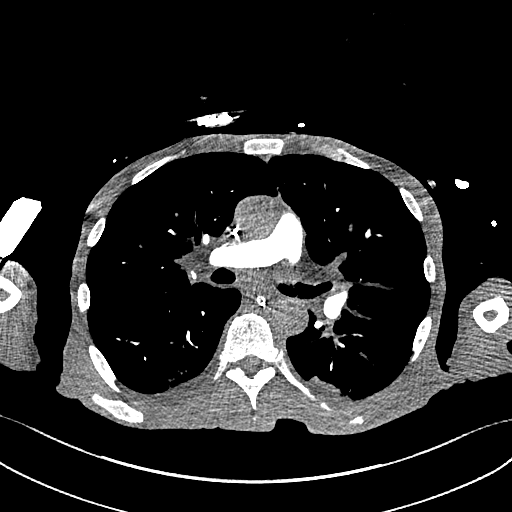
[im 237/355  lung]
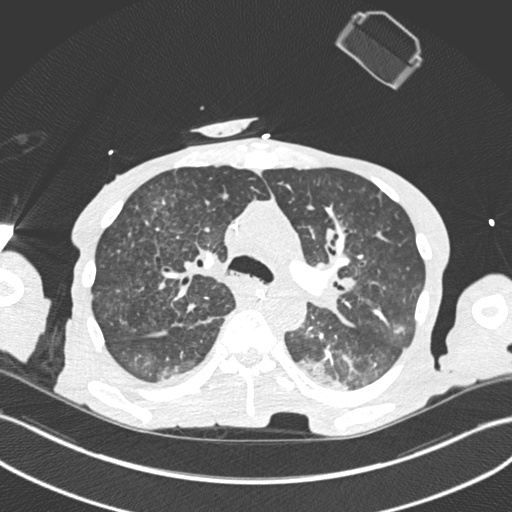
[im 276/355  mediastinal]
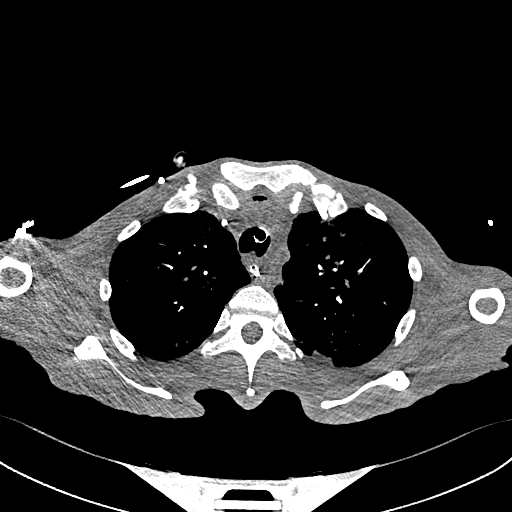
[im 296/355  lung]
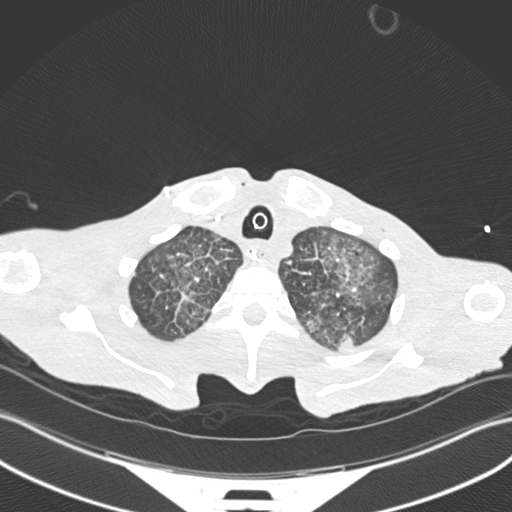
[im 315/355  mediastinal]
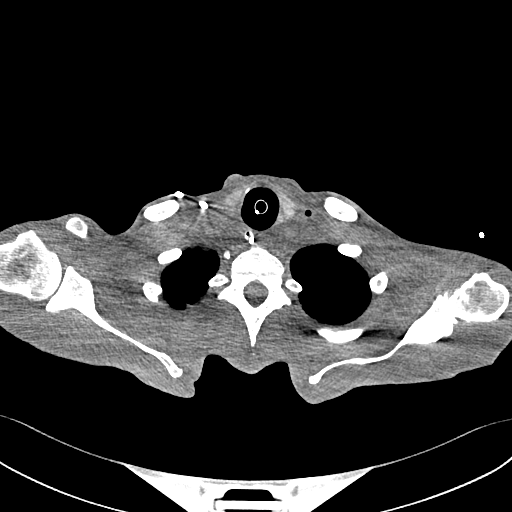
[im 335/355  lung]
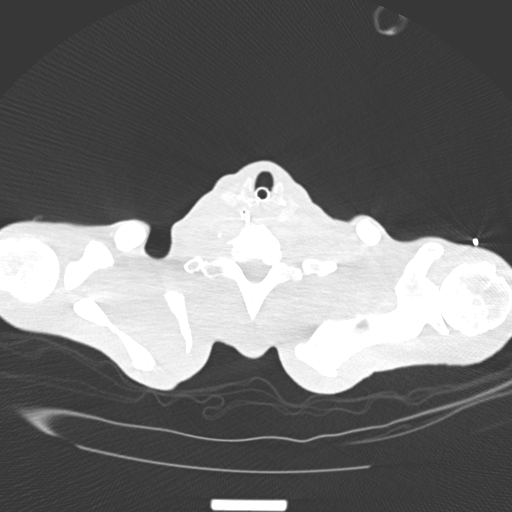

[Series 10: pe 2mm cor · coronal · 0.69mm/px · 1 of 151 slices shown]
[im 76/151  mediastinal]
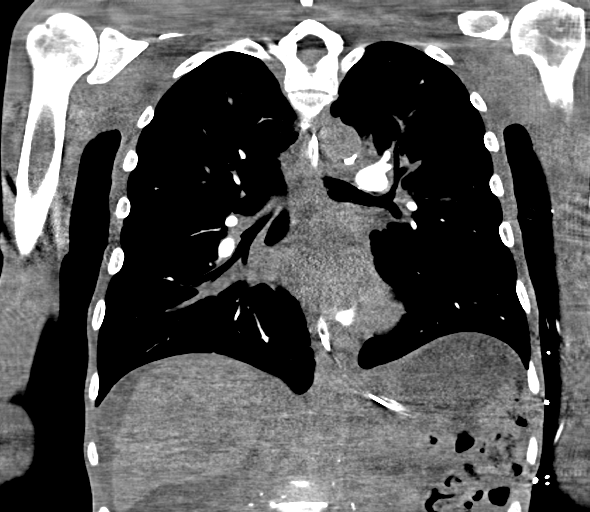

[16 of 36 positions shown; findings below may reference images not displayed]

RADIATION DOSE REDUCTION: This exam was performed according to the
departmental dose-optimization program which includes automated
exposure control, adjustment of the mA and/or kV according to
patient size and/or use of iterative reconstruction technique.

CONTRAST:  85mL OMNIPAQUE IOHEXOL 350 MG/ML SOLN
FINDINGS: CTA CHEST FINDINGS

Cardiovascular: There is adequate opacification of the pulmonary
arterial tree. No intraluminal filling defect identified to suggest
acute pulmonary embolism. The central pulmonary arteries are of
normal caliber.

Moderate coronary artery calcification. Global cardiac size is
within normal limits though left ventricular dilation is noted,
progressive since prior examination. No pericardial effusion. The
thoracic aorta is of normal caliber. Mild atherosclerotic
calcification noted within the aortic arch. Right internal jugular
chest port tip noted within the superior vena cava.

Mediastinum/Nodes: Endotracheal tube seen 4 cm above the carina.
Nasogastric tube extends into the proximal body of the stomach.
There is marked body wall wasting and diffuse subcutaneous and
mediastinal edema which impairs delineation of the mediastinal
structures. Visualized thyroid is unremarkable.

Lungs/Pleura: There is interlobular septal thickening, best
appreciated within the lung apices, with superimposed ground-glass
pulmonary infiltrate most suggestive of interstitial and alveolar
pulmonary edema. Upper lobe predominance may relate to supine
positioning and pulmonary vascular redistribution. Layering debris
noted within the central airways. Mild bibasilar dependent
atelectasis. No pneumothorax or pleural effusion.

Musculoskeletal: No acute bone abnormality. No lytic or blastic bone
lesion.

Review of the MIP images confirms the above findings.

CT ABDOMEN and PELVIS FINDINGS

Hepatobiliary: Innumerable hepatic metastases are seen throughout
both hepatic lobes in keeping with the patient's known metastatic
pancreatic cancer. Mild pneumobilia is present. Palliative stenting
of the extrahepatic bile duct has been performed with a a metallic
biliary stent noted extending into the second portion of the
duodenum. Gallbladder is unremarkable.

Pancreas: Heterogeneous mass within the head of the pancreas appears
enlarged, measuring roughly 4.6 x 6.7 cm and demonstrates occlusion
of the superior mesenteric vein centrally, new since prior
examination. There is atrophy of the pancreatic body and tail again
noted.

Spleen: Unremarkable

Adrenals/Urinary Tract: Adrenal glands are unremarkable. Kidneys are
normal, without renal calculi, focal lesion, or hydronephrosis.
Bladder is decompressed with a Foley catheter balloon seen within
its lumen.

Stomach/Bowel: The stomach is mildly distended and fluid-filled. The
pancreatic mass abuts an narrows the first portion of the duodenum,
best seen on image # [DATE], possibly resulting in some degree of
gastric outlet obstruction. The stomach, small bowel, and large
bowel are otherwise unremarkable. Mild ascites has developed.

Vascular/Lymphatic: Mild aortoiliac atherosclerotic calcification.
No aortic aneurysm. Multiple necrotic retroperitoneal lymph nodes
have developed, in keeping with developing nodal metastases within
the a perirenal region

Reproductive: Prostate is unremarkable.

Other: There is marked diffuse body wall wasting and subcutaneous
edema noted. There is extensive retroperitoneal edema which limits
delineation of the retroperitoneal structures.

Musculoskeletal: No acute bone abnormality. No lytic or blastic bone
lesion.

Review of the MIP images confirms the above findings.
IMPRESSION: Interval disease progression with enlargement of the primary mass
within the pancreatic head now resulting in occlusion of the
superior mesenteric vein at the portosplenic confluence, developing
necrotic retroperitoneal adenopathy, progression of hepatic
metastatic disease and possible abutment and partial obstruction of
the first portion of the duodenum.

Marked body wall wasting with severe anasarca.

No pulmonary embolism.

Interval development of left ventricular dilation.

Diffuse interstitial and ground-glass alveolar pulmonary infiltrate
most in keeping with interstitial and alveolar pulmonary edema,
possibly cardiogenic in nature.

## 2023-11-25 IMAGING — CT CT HEAD W/O CM
4 series · 15 of 47 positions shown, 17 images · non-contrast
Comparison: None.

CLINICAL DATA: Normal GB deficit.



[Series 3: head without · axial · non-contrast · 0.47mm/px · z∈[+101,+226]mm · 7 of 35 slices shown, 9 images]
[im 5/35  brain]
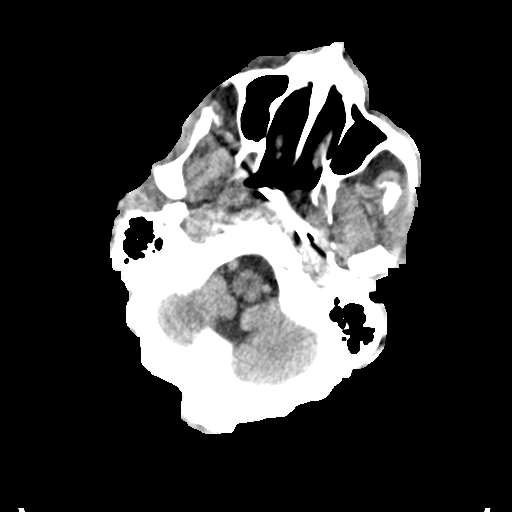
[im 5/35  bone]
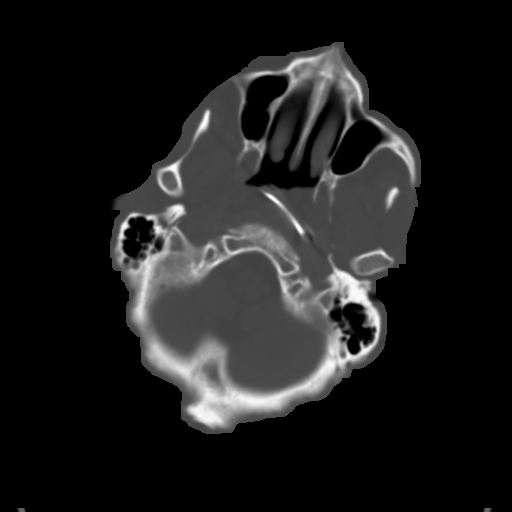
[im 9/35  brain]
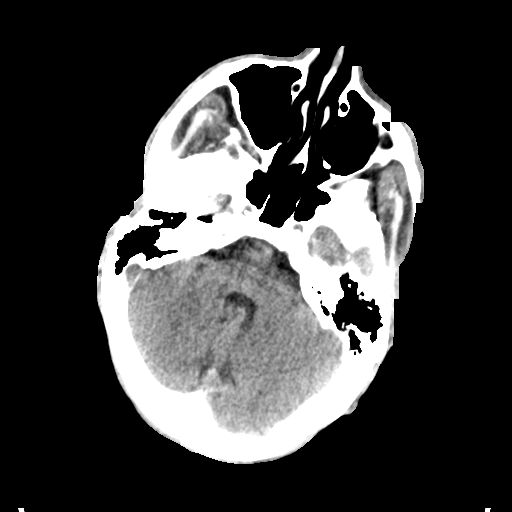
[im 13/35  brain]
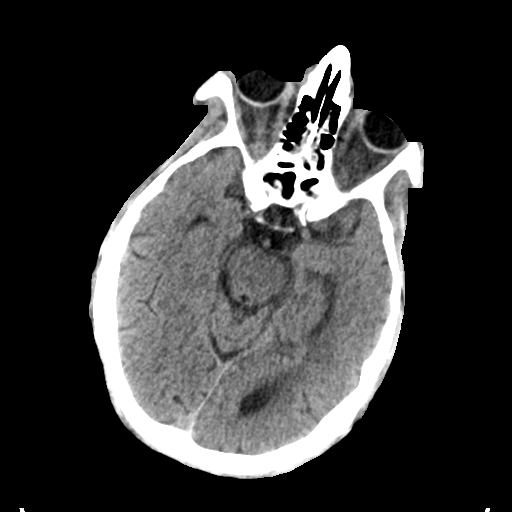
[im 18/35  brain]
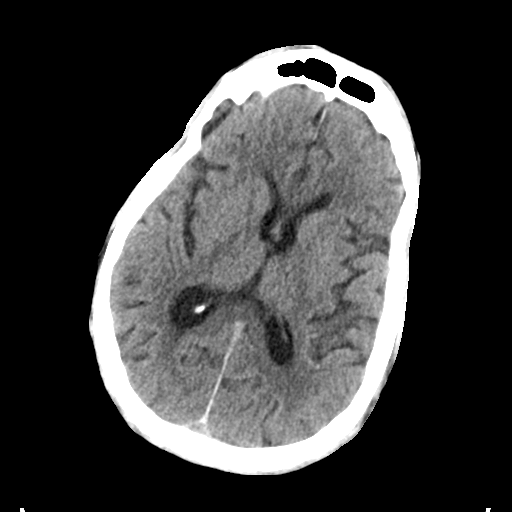
[im 22/35  brain]
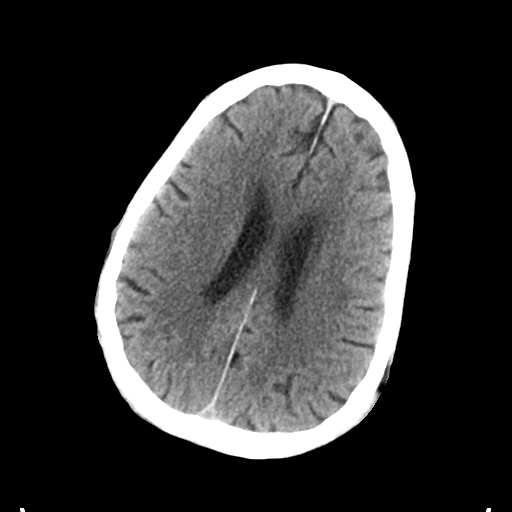
[im 22/35  bone]
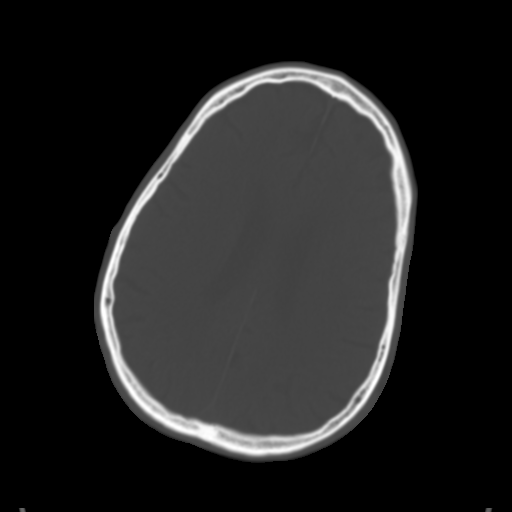
[im 26/35  brain]
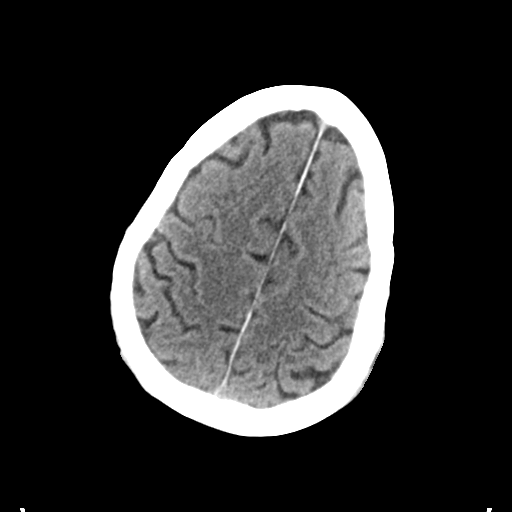
[im 30/35  brain]
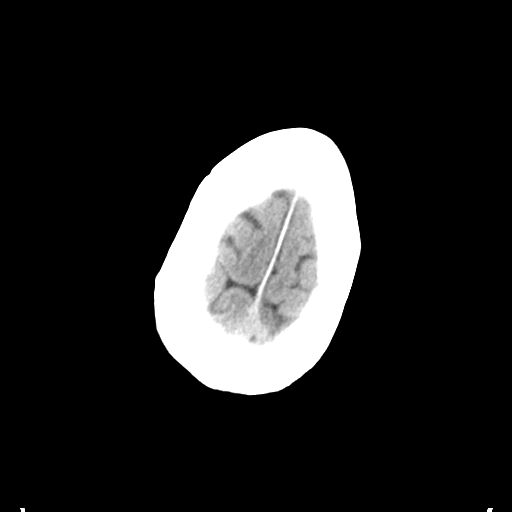

[Series 4: head bone · axial · 0.47mm/px · z∈[+97,+115]mm · 2 of 87 slices shown]
[im 9/87  bone]
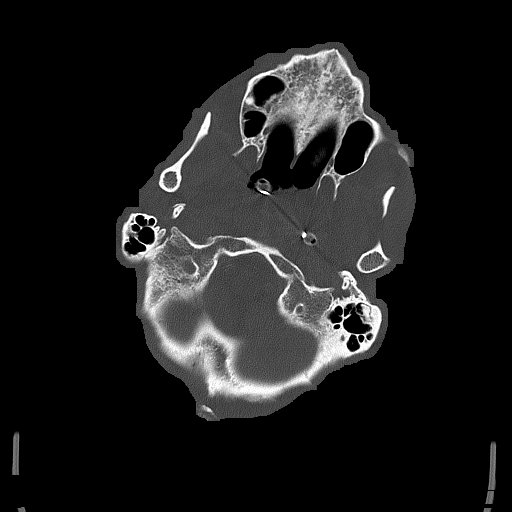
[im 18/87  bone]
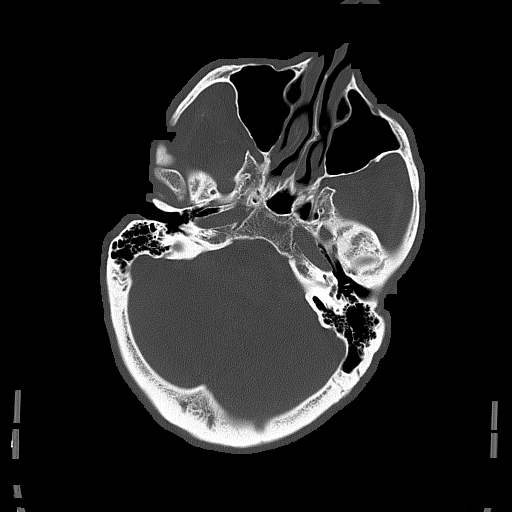

[Series 5: head without cor · coronal · non-contrast · 0.34mm/px · 3 of 74 slices shown]
[im 25/74  brain]
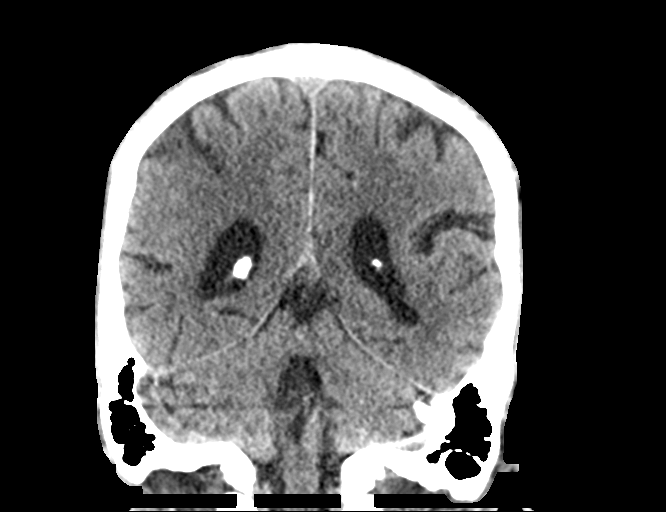
[im 33/74  brain]
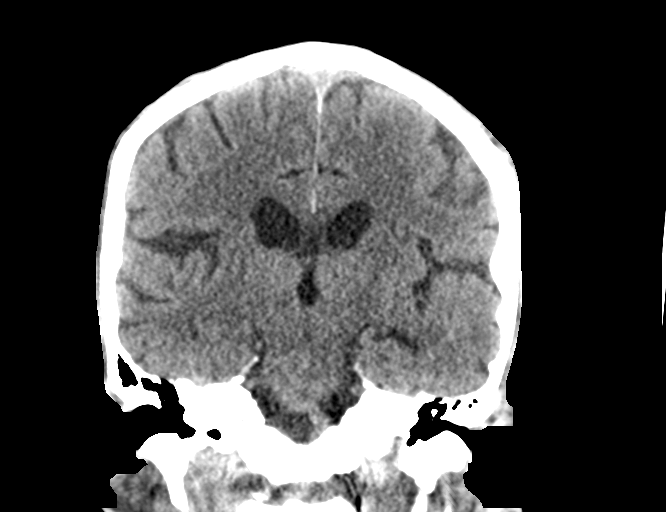
[im 41/74  brain]
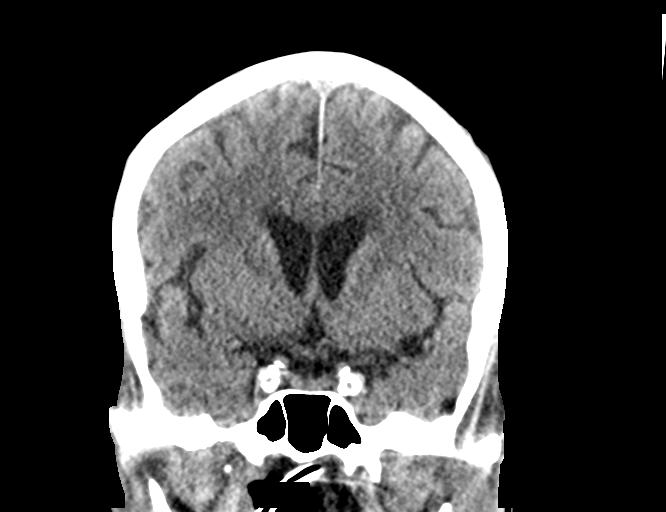

[Series 6: head without sag · sagittal · non-contrast · 0.34mm/px · 3 of 66 slices shown]
[im 22/66  brain]
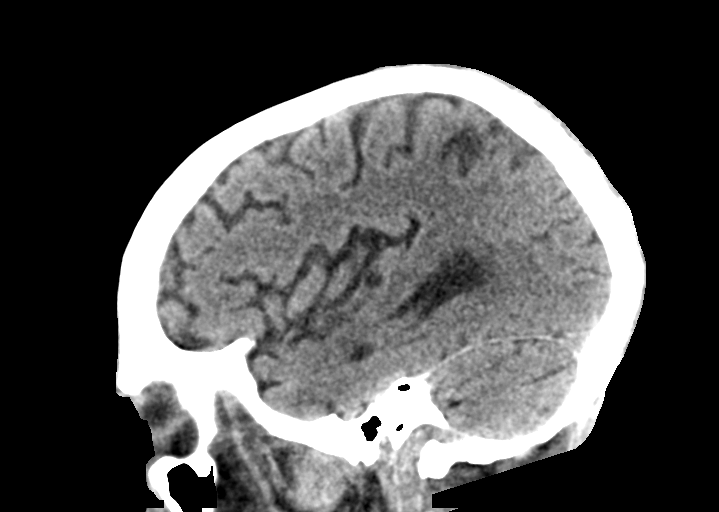
[im 33/66  brain]
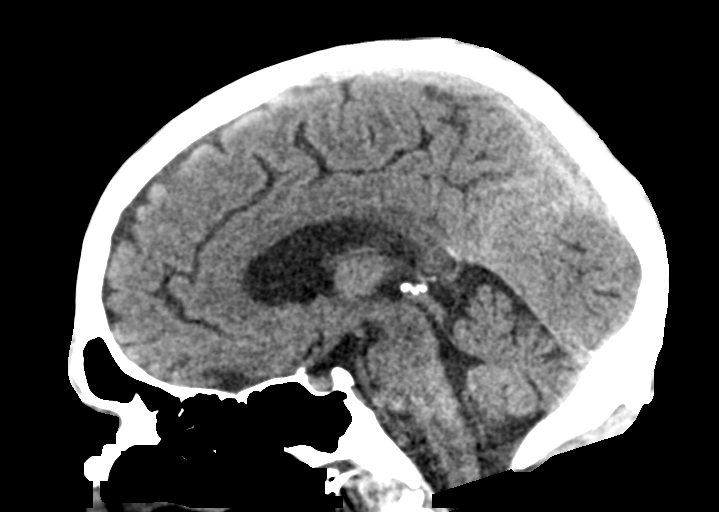
[im 44/66  brain]
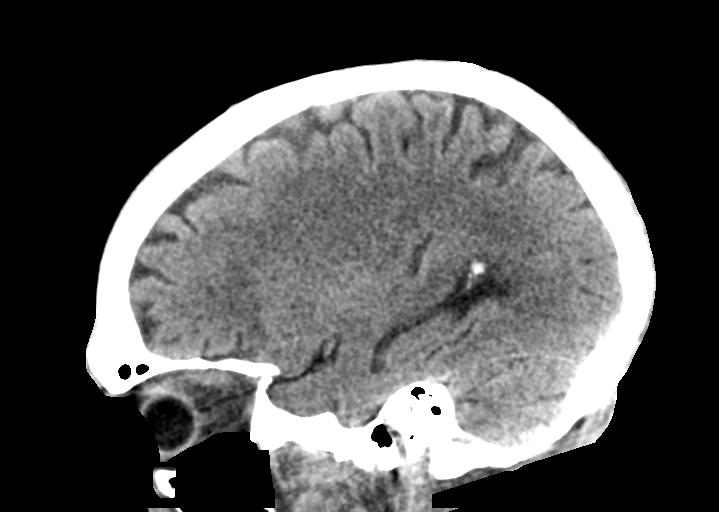

[15 of 47 positions shown; findings below may reference images not displayed]

FINDINGS: Brain: The ventricles and sulci appropriate size for patient's age.
Mild periventricular and deep white matter chronic microvascular
ischemic changes noted. There is no acute intracranial hemorrhage.
No mass effect or midline shift no extra-axial fluid collection.

Vascular: No hyperdense vessel or unexpected calcification.

Skull: Normal. Negative for fracture or focal lesion.

Sinuses/Orbits: No acute finding.

Other: Partially visualized enteric tube in the oropharynx.
IMPRESSION: No acute intracranial pathology.
